# Patient Record
Sex: Male | Born: 1935 | Race: White | Hispanic: No | Marital: Married | Smoking: Never smoker
Health system: Southern US, Community
[De-identification: ages and names within clinical notes are randomized; demographics above are authoritative.]

## PROBLEM LIST (undated history)

## (undated) DIAGNOSIS — M1990 Unspecified osteoarthritis, unspecified site: Secondary | ICD-10-CM

## (undated) DIAGNOSIS — H269 Unspecified cataract: Secondary | ICD-10-CM

## (undated) DIAGNOSIS — E785 Hyperlipidemia, unspecified: Secondary | ICD-10-CM

## (undated) DIAGNOSIS — I2510 Atherosclerotic heart disease of native coronary artery without angina pectoris: Secondary | ICD-10-CM

## (undated) DIAGNOSIS — D649 Anemia, unspecified: Secondary | ICD-10-CM

## (undated) DIAGNOSIS — I10 Essential (primary) hypertension: Secondary | ICD-10-CM

## (undated) DIAGNOSIS — C4490 Unspecified malignant neoplasm of skin, unspecified: Secondary | ICD-10-CM

## (undated) DIAGNOSIS — F329 Major depressive disorder, single episode, unspecified: Secondary | ICD-10-CM

## (undated) DIAGNOSIS — N200 Calculus of kidney: Secondary | ICD-10-CM

## (undated) DIAGNOSIS — F0390 Unspecified dementia without behavioral disturbance: Secondary | ICD-10-CM

## (undated) DIAGNOSIS — F32A Depression, unspecified: Secondary | ICD-10-CM

## (undated) HISTORY — PX: CORONARY ARTERY BYPASS GRAFT: SHX141

## (undated) HISTORY — DX: Major depressive disorder, single episode, unspecified: F32.9

## (undated) HISTORY — PX: TOTAL HIP ARTHROPLASTY: SHX124

## (undated) HISTORY — DX: Hyperlipidemia, unspecified: E78.5

## (undated) HISTORY — DX: Anemia, unspecified: D64.9

## (undated) HISTORY — DX: Essential (primary) hypertension: I10

## (undated) HISTORY — DX: Unspecified malignant neoplasm of skin, unspecified: C44.90

## (undated) HISTORY — DX: Atherosclerotic heart disease of native coronary artery without angina pectoris: I25.10

## (undated) HISTORY — DX: Unspecified dementia, unspecified severity, without behavioral disturbance, psychotic disturbance, mood disturbance, and anxiety: F03.90

## (undated) HISTORY — DX: Unspecified osteoarthritis, unspecified site: M19.90

## (undated) HISTORY — DX: Depression, unspecified: F32.A

## (undated) HISTORY — DX: Unspecified cataract: H26.9

## (undated) HISTORY — DX: Calculus of kidney: N20.0

## (undated) HISTORY — PX: REPLACEMENT TOTAL KNEE: SUR1224

## (undated) HISTORY — PX: OTHER SURGICAL HISTORY: SHX169

---

## 1996-09-25 DIAGNOSIS — N200 Calculus of kidney: Secondary | ICD-10-CM

## 1996-09-25 HISTORY — DX: Calculus of kidney: N20.0

## 2016-05-16 LAB — VITAMIN B12: VITAMIN B 12: 420

## 2016-08-16 LAB — BASIC METABOLIC PANEL
GLUCOSE: 106
POTASSIUM: 3.7 (ref 3.4–5.3)
Sodium: 142 (ref 137–147)

## 2016-08-16 LAB — CBC AND DIFFERENTIAL
HEMATOCRIT: 39 — AB (ref 41–53)
Hemoglobin: 12.8 — AB (ref 13.5–17.5)
Platelets: 163 (ref 150–399)
WBC: 7.3

## 2016-08-16 LAB — HEPATIC FUNCTION PANEL
AST: 32 (ref 14–40)
Alkaline Phosphatase: 84 (ref 25–125)
BILIRUBIN, TOTAL: 0.7
Bilirubin, Direct: 0.2

## 2016-08-16 LAB — TSH: TSH: 5.79 (ref <=5.90)

## 2016-08-21 LAB — BASIC METABOLIC PANEL
BUN: 16 (ref 4–21)
CREATININE: 1.1 (ref <=1.3)

## 2016-12-13 LAB — CBC AND DIFFERENTIAL
HEMATOCRIT: 38 — AB (ref 41–53)
HEMOGLOBIN: 12.6 — AB (ref 13.5–17.5)
Platelets: 152 (ref 150–399)
WBC: 8.7

## 2016-12-13 LAB — BASIC METABOLIC PANEL
BUN: 18 (ref 4–21)
Creatinine: 1.1 (ref <=1.3)
GLUCOSE: 101
Potassium: 4.3 (ref 3.4–5.3)
SODIUM: 144 (ref 137–147)

## 2017-04-02 LAB — HEPATIC FUNCTION PANEL
ALT: 12 (ref 10–40)
AST: 13 — AB (ref 14–40)
Alkaline Phosphatase: 245 — AB (ref 25–125)
BILIRUBIN DIRECT: 0.2 (ref 0.01–0.4)
BILIRUBIN, TOTAL: 0.5

## 2017-04-02 LAB — LIPID PANEL
HDL: 60 (ref 35–70)
LDL CALC: 65
Triglycerides: 96 (ref 40–160)

## 2017-04-02 LAB — TSH: TSH: 4.94 (ref 0.41–5.90)

## 2017-04-02 LAB — BASIC METABOLIC PANEL
BUN: 22 — AB (ref 4–21)
CREATININE: 0.6 (ref 0.6–1.3)
POTASSIUM: 4.5 (ref 3.4–5.3)
SODIUM: 142 (ref 137–147)

## 2017-04-02 LAB — CBC AND DIFFERENTIAL
HCT: 37 — AB (ref 41–53)
Hemoglobin: 12.1 — AB (ref 13.5–17.5)
Platelets: 156 (ref 150–399)
WBC: 7.9

## 2017-04-09 LAB — HEPATIC FUNCTION PANEL: ALK PHOS: 94 (ref 25–125)

## 2017-04-12 LAB — HEPATIC FUNCTION PANEL: ALK PHOS: 94 (ref 25–125)

## 2017-04-27 LAB — HEPATIC FUNCTION PANEL: ALK PHOS: 94 (ref 25–125)

## 2017-08-20 ENCOUNTER — Encounter: Payer: Self-pay | Admitting: Nurse Practitioner

## 2017-08-20 ENCOUNTER — Ambulatory Visit (INDEPENDENT_AMBULATORY_CARE_PROVIDER_SITE_OTHER): Payer: Medicare Other | Admitting: Nurse Practitioner

## 2017-08-20 VITALS — BP 132/78 | HR 64 | Temp 98.7°F | Resp 17 | Ht 68.0 in | Wt 169.4 lb

## 2017-08-20 DIAGNOSIS — F0390 Unspecified dementia without behavioral disturbance: Secondary | ICD-10-CM | POA: Diagnosis not present

## 2017-08-20 DIAGNOSIS — I10 Essential (primary) hypertension: Secondary | ICD-10-CM | POA: Insufficient documentation

## 2017-08-20 DIAGNOSIS — E785 Hyperlipidemia, unspecified: Secondary | ICD-10-CM | POA: Diagnosis not present

## 2017-08-20 DIAGNOSIS — M1990 Unspecified osteoarthritis, unspecified site: Secondary | ICD-10-CM | POA: Diagnosis not present

## 2017-08-20 DIAGNOSIS — I25810 Atherosclerosis of coronary artery bypass graft(s) without angina pectoris: Secondary | ICD-10-CM

## 2017-08-20 DIAGNOSIS — I2510 Atherosclerotic heart disease of native coronary artery without angina pectoris: Secondary | ICD-10-CM | POA: Insufficient documentation

## 2017-08-20 NOTE — Patient Instructions (Addendum)
36 hour day   Increase activity, mental and physical stimulation.   Follow up in 6 weeks with fasting blood work prior to appt for EV

## 2017-08-20 NOTE — Progress Notes (Signed)
Careteam: Patient Care Team: [name], NP as PCP - General (Geriatric Medicine) Solic, [name], MD as Consulting Physician (Orthopedic Surgery)  Advanced Directive information Does Patient Have a Medical Advance Directive?: Yes, Type of Advance Directive: [place];Living will, Does patient want to make changes to medical advance directive?: No - Patient declined  No Known Allergies  Chief Complaint  Patient presents with  . Medical Management of Chronic Issues    Pt is being seen to establish care for management of chronic conditions.   . Other    Wife and daughter in room      HPI: Patient is a 83 y.o. male seen in the office today to establish care and medical management. Pt recently moved from [place] and moved in with daughter.  Last AWV was in the Spring and blood work as well was then   OA- takes routinely 1 in the AM and 1 in the PM which controls pain.   HTN- atenolol 12.5 mg daily and ASA daily   Dementia- taking aricept 10 mg daily and namenda 10 mg BID, has had a complete work up for this. Mild when originally diagnosised but this has been a few years Using fish oil for the mind.   Hyperlipidemia- zocor daily.  B12 def-  Currently on Vit B oral supplement.   Liberalized diet due to poor PO intake.   Hx of skin cancers, followed with dermatology in [place] has not seen anyone in [place] yet.   Hx of kidney stones, has changed diet and not having any further issues.   CAD- no symptoms, had a stress test that was abnormal and called him while he was on vacation to come in for CABG, then had stents a few years later.   Recent colonoscopy which was most likely normal, family unsure of follow up.     Review of Systems:  Review of Systems  Constitutional: Negative for chills, fever and weight loss.  HENT: Negative for tinnitus.   Respiratory: Negative for cough, sputum production and shortness of breath.   Cardiovascular:  Negative for chest pain, palpitations and leg swelling.  Gastrointestinal: Negative for abdominal pain, constipation, diarrhea and heartburn.       Fecal incontinence   Genitourinary: Positive for frequency. Negative for dysuria and urgency.  Musculoskeletal: Positive for joint pain. Negative for back pain, falls and myalgias.  Skin: Negative.        Hx of skin cancer  Neurological: Positive for weakness. Negative for dizziness and headaches.  Endo/Heme/Allergies: Bruises/bleeds easily.  Psychiatric/Behavioral: Positive for memory loss. Negative for depression. The patient does not have insomnia.     Past Medical History:  Diagnosis Date  . High blood pressure   . Kidney stone   . Skin cancer    face, ears   Past Surgical History:  Procedure Laterality Date  . kidney stone removal    . open heart surgery    . quadruple bypass     about 22 years ago  . REPLACEMENT TOTAL KNEE Left   . stints     has 3, about 20 years ago  . TOTAL HIP ARTHROPLASTY Right    2008   Social History:   reports that  has never smoked. he has never used smokeless tobacco. He reports that he does not drink alcohol or use drugs.  Family History  Problem Relation Age of Onset  . Stomach cancer Mother 72  . Esophageal cancer Mother 67  .  Stroke Father     Medications:   Medication List        Accurate as of 08/20/17  9:12 AM. Always use your most recent med list.          acetaminophen 500 MG tablet Commonly known as:  TYLENOL   aspirin EC 81 MG tablet   atenolol 25 MG tablet Commonly known as:  TENORMIN   donepezil 10 MG tablet Commonly known as:  ARICEPT   Fish Oil 500 MG Caps   memantine 10 MG tablet Commonly known as:  NAMENDA   simvastatin 10 MG tablet Commonly known as:  ZOCOR   vitamin B-12 500 MCG tablet Commonly known as:  CYANOCOBALAMIN        Physical Exam:  Vitals:   08/20/17 0854  BP: 132/78  Pulse: 64  Resp: 17  Temp: 98.7 F (37.1 C)  TempSrc:  Oral  SpO2: 97%  Weight: 169 lb 6.4 oz (76.8 kg)  Height: 5' 8"  (1.727 m)   Body mass index is 25.76 kg/m.  Physical Exam  Constitutional: He is oriented to person, place, and time. He appears well-developed and well-nourished. No distress.  HENT:  Head: Normocephalic and atraumatic.  Eyes: Conjunctivae and EOM are normal. Pupils are equal, round, and reactive to light.  Neck: Normal range of motion. Neck supple.  Cardiovascular: Normal rate, regular rhythm and normal heart sounds.  Pulmonary/Chest: Effort normal and breath sounds normal.  Abdominal: Soft. Bowel sounds are normal.  Musculoskeletal: He exhibits no edema or tenderness.       Right shoulder: He exhibits decreased range of motion.       Left shoulder: He exhibits decreased range of motion.  Neurological: He is alert and oriented to person, place, and time.  Skin: Skin is warm and dry. He is not diaphoretic.  Psychiatric: He has a normal mood and affect. Cognition and memory are impaired. He exhibits abnormal recent memory.    Labs reviewed: Basic Metabolic Panel: No results for input(s): NA, K, CL, CO2, GLUCOSE, BUN, CREATININE, CALCIUM, MG, PHOS, TSH in the last 8760 hours. Liver Function Tests: No results for input(s): AST, ALT, ALKPHOS, BILITOT, PROT, ALBUMIN in the last 8760 hours. No results for input(s): LIPASE, AMYLASE in the last 8760 hours. No results for input(s): AMMONIA in the last 8760 hours. CBC: No results for input(s): WBC, NEUTROABS, HGB, HCT, MCV, PLT in the last 8760 hours. Lipid Panel: No results for input(s): CHOL, HDL, LDLCALC, TRIG, CHOLHDL, LDLDIRECT in the last 8760 hours. TSH: No results for input(s): TSH in the last 8760 hours. A1C: No results found for: HGBA1C   Assessment/Plan 1. Hyperlipidemia, unspecified hyperlipidemia type -conts on zocor, due to dementia not on heart healthy diet.  - CMP with eGFR; Future - Lipid Panel; Future  2. Coronary artery disease involving coronary  bypass graft of native heart without angina pectoris S/p CABG and stent placement. Was established with cardiologist in [place] and saw routinely, will refer to Cardiology so he can have one in town.   3. Essential hypertension -controlled on atenolol.  - Ambulatory referral to Cardiology - CMP with eGFR; Future - CBC with Differential/Platelets; Future  4. Arthritis To bilateral shoulder and lower back, tylenol twice daily controls pain.   5. Dementia without behavioral disturbance, unspecified dementia type -pt with progressive memory loss, dx with dementia after workup, conts on aricept and namenda. Family helps take care of him.  - Ambulatory referral to [facility] for additional resources.  Next appt: 4 weeks for EV with MMSE and labs prior to appt.  Cody Morris. Harle Battiest  [facility] [phone] 8 am - 5 pm) [phone] (after hours)

## 2017-09-20 ENCOUNTER — Telehealth: Payer: Self-pay

## 2017-09-20 NOTE — Telephone Encounter (Signed)
SENT REFERRAL TO SCHEDULING 

## 2017-09-28 ENCOUNTER — Other Ambulatory Visit: Payer: Medicare Other

## 2017-09-28 DIAGNOSIS — E785 Hyperlipidemia, unspecified: Secondary | ICD-10-CM

## 2017-09-28 DIAGNOSIS — I10 Essential (primary) hypertension: Secondary | ICD-10-CM

## 2017-09-28 LAB — CBC WITH DIFFERENTIAL/PLATELET
BASOS ABS: 23 {cells}/uL (ref 0–200)
Basophils Relative: 0.3 %
EOS PCT: 3.3 %
Eosinophils Absolute: 257 cells/uL (ref 15–500)
HEMATOCRIT: 35.8 % — AB (ref 38.5–50.0)
HEMOGLOBIN: 12.2 g/dL — AB (ref 13.2–17.1)
LYMPHS ABS: 1287 {cells}/uL (ref 850–3900)
MCH: 34.4 pg — ABNORMAL HIGH (ref 27.0–33.0)
MCHC: 34.1 g/dL (ref 32.0–36.0)
MCV: 100.8 fL — ABNORMAL HIGH (ref 80.0–100.0)
MPV: 11.1 fL (ref 7.5–12.5)
Monocytes Relative: 6.3 %
NEUTROS ABS: 5741 {cells}/uL (ref 1500–7800)
Neutrophils Relative %: 73.6 %
Platelets: 178 10*3/uL (ref 140–400)
RBC: 3.55 10*6/uL — AB (ref 4.20–5.80)
RDW: 11.6 % (ref 11.0–15.0)
Total Lymphocyte: 16.5 %
WBC mixed population: 491 cells/uL (ref 200–950)
WBC: 7.8 10*3/uL (ref 3.8–10.8)

## 2017-09-28 LAB — COMPLETE METABOLIC PANEL WITH GFR
AG RATIO: 2.1 (calc) (ref 1.0–2.5)
ALT: 12 U/L (ref 9–46)
AST: 21 U/L (ref 10–35)
Albumin: 4 g/dL (ref 3.6–5.1)
Alkaline phosphatase (APISO): 81 U/L (ref 40–115)
BUN/Creatinine Ratio: 16 (calc) (ref 6–22)
BUN: 20 mg/dL (ref 7–25)
CALCIUM: 9.3 mg/dL (ref 8.6–10.3)
CHLORIDE: 110 mmol/L (ref 98–110)
CO2: 28 mmol/L (ref 20–32)
Creat: 1.29 mg/dL — ABNORMAL HIGH (ref 0.70–1.11)
GFR, EST AFRICAN AMERICAN: 59 mL/min/{1.73_m2} — AB (ref >=60)
GFR, EST NON AFRICAN AMERICAN: 51 mL/min/{1.73_m2} — AB (ref >=60)
GLOBULIN: 1.9 g/dL (ref 1.9–3.7)
Glucose, Bld: 94 mg/dL (ref 65–99)
POTASSIUM: 3.9 mmol/L (ref 3.5–5.3)
SODIUM: 143 mmol/L (ref 135–146)
TOTAL PROTEIN: 5.9 g/dL — AB (ref 6.1–8.1)
Total Bilirubin: 0.7 mg/dL (ref 0.2–1.2)

## 2017-09-28 LAB — LIPID PANEL
CHOL/HDL RATIO: 1.9 (calc) (ref <5.0)
Cholesterol: 90 mg/dL (ref <200)
HDL: 47 mg/dL (ref >40)
LDL Cholesterol (Calc): 29 mg/dL (calc)
Non-HDL Cholesterol (Calc): 43 mg/dL (calc) (ref <130)
Triglycerides: 64 mg/dL (ref <150)

## 2017-10-02 ENCOUNTER — Ambulatory Visit (INDEPENDENT_AMBULATORY_CARE_PROVIDER_SITE_OTHER): Payer: Medicare Other | Admitting: Nurse Practitioner

## 2017-10-02 ENCOUNTER — Encounter: Payer: Self-pay | Admitting: Nurse Practitioner

## 2017-10-02 VITALS — BP 130/84 | HR 81 | Temp 98.2°F | Resp 17 | Ht 68.0 in | Wt 168.4 lb

## 2017-10-02 DIAGNOSIS — M1990 Unspecified osteoarthritis, unspecified site: Secondary | ICD-10-CM

## 2017-10-02 DIAGNOSIS — D539 Nutritional anemia, unspecified: Secondary | ICD-10-CM

## 2017-10-02 DIAGNOSIS — E785 Hyperlipidemia, unspecified: Secondary | ICD-10-CM | POA: Diagnosis not present

## 2017-10-02 DIAGNOSIS — F0391 Unspecified dementia with behavioral disturbance: Secondary | ICD-10-CM

## 2017-10-02 DIAGNOSIS — I10 Essential (primary) hypertension: Secondary | ICD-10-CM | POA: Diagnosis not present

## 2017-10-02 DIAGNOSIS — G4720 Circadian rhythm sleep disorder, unspecified type: Secondary | ICD-10-CM

## 2017-10-02 MED ORDER — DONEPEZIL HCL 10 MG PO TABS: 10.0000 mg | ORAL_TABLET | Freq: Every day | ORAL | 1 refills | Status: AC

## 2017-10-02 MED ORDER — MEMANTINE HCL 10 MG PO TABS: 10.0000 mg | ORAL_TABLET | Freq: Two times a day (BID) | ORAL | 1 refills | Status: AC

## 2017-10-02 MED ORDER — SIMVASTATIN 10 MG PO TABS: 10.0000 mg | ORAL_TABLET | Freq: Every day | ORAL | 1 refills | Status: AC

## 2017-10-02 NOTE — Patient Instructions (Addendum)
Consider Seroquel for sleep, behaviors and hallucinations This medication is not without side effects, could have gait abnormality, increase fatigue during the day- however we will start at a low dose if you start this medication Also could do another sleep aid (Trazadone) but it would not help behaviors or hallucinations. To avoid tylenol PM Can also try melatonin 3 mg every night for sleep. To take aricept in the morning.   To keep up during the day so he sleeps at night.  To follow up in 3 months with Dr Mariea Clonts for routine follow up

## 2017-10-02 NOTE — Progress Notes (Signed)
Careteam: Patient Care Team: [name], NP as PCP - General (Geriatric Medicine) Solic, [name], MD as Consulting Physician (Orthopedic Surgery)  Advanced Directive information Type of Advance Directive: [place];Living will, Does patient want to make changes to medical advance directive?: No - Patient declined  No Known Allergies  Chief Complaint  Patient presents with  . Medical Management of Chronic Issues    Pt is being seen for a complete physical for management of chronic conditions.   Marland Kitchen MMSE    15/30: failed clock drawing.   . Depression    Score of 0  . Medication Refill    Rx pended for donepezil, memantine, simvastatin     HPI: Patient is a 84 y.o. male seen in the office today for extended visit and medical management. Here with daughter today.  Pt with hx of dementia, OA, HTN, CAD, hyperlipidemia.  Dementia- MMSE today 15/30, daughter report some hallucinations. More confusion lately. Wondering where his wife was- she had been there the whole time.  Giving him tylenol PM to help him sleep, very restless, wandering around and increased behaviors with worsening confusion in the evening.  Bad days are coming more frequently.  Losing more of his ADLs, wife is his primary caretaker  Dreams vividly- taking aricept in the pm.    CAD- follow up scheduled with cardiologist- no complaints of chest pains, never had chest pains evxen prior to CABG.   Review of Systems:  Review of Systems  Constitutional: Negative for chills, fever and weight loss.  HENT: Negative for tinnitus.   Respiratory: Negative for cough, sputum production and shortness of breath.   Cardiovascular: Negative for chest pain, palpitations and leg swelling.  Gastrointestinal: Negative for abdominal pain, constipation, diarrhea and heartburn.       Fecal incontinence   Genitourinary: Negative for dysuria, frequency and urgency.  Musculoskeletal: Positive for falls and joint  pain. Negative for back pain and myalgias.  Skin: Negative.        Hx of skin cancer  Neurological: Positive for weakness. Negative for dizziness and headaches.  Endo/Heme/Allergies: Bruises/bleeds easily.  Psychiatric/Behavioral: Positive for memory loss. Negative for depression. The patient does not have insomnia.     Past Medical History:  Diagnosis Date  . Arthritis   . CAD (coronary artery disease)   . High blood pressure   . Hyperlipidemia   . Kidney stones 1998  . Skin cancer    face, ears   Past Surgical History:  Procedure Laterality Date  . CORONARY ARTERY BYPASS GRAFT    . kidney stone removal    . open heart surgery    . quadruple bypass     about 22 years ago  . REPLACEMENT TOTAL KNEE Left   . stints     has 3, about 20 years ago  . TOTAL HIP ARTHROPLASTY Right    2008   Social History:   reports that  has never smoked. he has never used smokeless tobacco. He reports that he does not drink alcohol or use drugs.  Family History  Problem Relation Age of Onset  . Stomach cancer Mother 5  . Esophageal cancer Mother 27  . Stroke Father     Medications:   Medication List        Accurate as of 10/02/17 11:04 AM. Always use your most recent med list.          acetaminophen 500 MG tablet Commonly known as:  TYLENOL  aspirin EC 81 MG tablet   atenolol 25 MG tablet Commonly known as:  TENORMIN   diphenhydramine-acetaminophen 25-500 MG Tabs tablet Commonly known as:  TYLENOL PM   donepezil 10 MG tablet Commonly known as:  ARICEPT   Fish Oil 500 MG Caps   memantine 10 MG tablet Commonly known as:  NAMENDA   simvastatin 10 MG tablet Commonly known as:  ZOCOR   vitamin B-12 500 MCG tablet Commonly known as:  CYANOCOBALAMIN        Physical Exam:  Vitals:   10/02/17 1049  BP: 130/84  Pulse: 81  Resp: 17  Temp: 98.2 F (36.8 C)  TempSrc: Oral  SpO2: 98%  Weight: 168 lb 6.4 oz (76.4 kg)  Height: 5\' 8"  (1.727 m)   Body mass index  is 25.61 kg/m.  Physical Exam  Constitutional: He is oriented to person, place, and time. He appears well-developed and well-nourished. No distress.  HENT:  Head: Normocephalic and atraumatic.  Right Ear: External ear normal.  Left Ear: External ear normal.  Nose: Nose normal.  Mouth/Throat: Oropharynx is clear and moist. No oropharyngeal exudate.  Eyes: Conjunctivae and EOM are normal. Pupils are equal, round, and reactive to light.  Neck: Normal range of motion. Neck supple.  Cardiovascular: Normal rate, regular rhythm and normal heart sounds.  Pulmonary/Chest: Effort normal and breath sounds normal.  Abdominal: Soft. Bowel sounds are normal.  Musculoskeletal: He exhibits no edema or tenderness.       Right shoulder: He exhibits decreased range of motion.       Left shoulder: He exhibits decreased range of motion.       Right hip: He exhibits decreased range of motion.  Neurological: He is alert and oriented to person, place, and time.  Skin: Skin is warm and dry. He is not diaphoretic.  Psychiatric: He has a normal mood and affect. Cognition and memory are impaired. He exhibits abnormal recent memory.    Labs reviewed: Basic Metabolic Panel: Recent Labs    09/28/17 0943  NA 143  K 3.9  CL 110  CO2 28  GLUCOSE 94  BUN 20  CREATININE 1.29*  CALCIUM 9.3   Liver Function Tests: Recent Labs    09/28/17 0943  AST 21  ALT 12  BILITOT 0.7  PROT 5.9*   No results for input(s): LIPASE, AMYLASE in the last 8760 hours. No results for input(s): AMMONIA in the last 8760 hours. CBC: Recent Labs    09/28/17 0943  WBC 7.8  NEUTROABS 5,741  HGB 12.2*  HCT 35.8*  MCV 100.8*  PLT 178   Lipid Panel: Recent Labs    09/28/17 0943  CHOL 90  HDL 47  TRIG 64  CHOLHDL 1.9   TSH: No results for input(s): TSH in the last 8760 hours. A1C: No results found for: HGBA1C   Assessment/Plan 1. Dementia with behavioral disturbance, unspecified dementia type -progressive  decline in memory, MMSE 15/30 today -now with hallucinations and increase in behaviors and wandering at night. Also not sleeping well. -discussing moving aricept to daytime due to vivid dreams.  -discussed seroquel start with daughter however ultimately wife will decide. Discussed risk vs benefits.  - donepezil (ARICEPT) 10 MG tablet; Take 1 tablet (10 mg total) by mouth at bedtime.  Dispense: 90 tablet; Refill: 1 - memantine (NAMENDA) 10 MG tablet; Take 1 tablet (10 mg total) by mouth 2 (two) times daily.  Dispense: 180 tablet; Refill: 1  2. Essential hypertension -stable on current regimen  3. Macrocytic anemia -on B12 supplement, no recent B12 level will follow up  - Vitamin B12  4. Hyperlipidemia, unspecified hyperlipidemia type LDL at goal - simvastatin (ZOCOR) 10 MG tablet; Take 1 tablet (10 mg total) by mouth daily.  Dispense: 90 tablet; Refill: 1  5. Arthritis Mostly in shoulder and right knee and hip, tylenol controls pain.   6. Sleep-wake cycle disorder Sleeping more during the day and not as much at night. Encouraged activities and stimulatin during the daytime hours so he will sleep more at night.   Next appt: 3 months with Dr Mariea Clonts, sooner if needed  [place] K. Harle Battiest  [facility] [phone] 8 am - 5 pm) [phone] (after hours)

## 2017-10-03 LAB — VITAMIN B12: Vitamin B-12: 358 pg/mL (ref 200–1100)

## 2017-10-04 MED ORDER — QUETIAPINE FUMARATE 25 MG PO TABS: 12.5000 mg | ORAL_TABLET | Freq: Every day | ORAL | 1 refills | Status: DC

## 2017-10-04 NOTE — Addendum Note (Signed)
Addended by: [name] on: 10/04/2017 10:02 AM   Modules accepted: Orders

## 2017-10-11 ENCOUNTER — Telehealth: Payer: Self-pay | Admitting: *Deleted

## 2017-10-11 NOTE — Telephone Encounter (Signed)
Wife notified and agreed. Will call us with any changes.

## 2017-10-11 NOTE — Telephone Encounter (Signed)
Okay to give him mucinex DM (this is for cough and congestion) twice daily with full glass of water. Would monitor the talking in sleep this may improve

## 2017-10-11 NOTE — Telephone Encounter (Signed)
Patient wife, Kalman Shan called and stated that patient now has the cold she had and wants to know if it would be ok to give him the Mucinex. Has Nasal congestion and cough.   Also stated that since starting the new medication Seroquel (only giving 1/2 tablet) he is constantly talking in his sleep. Talks all night. Talks out of his head. Talks like he's someplace else.   Please Advise.

## 2017-10-12 ENCOUNTER — Telehealth: Payer: Self-pay | Admitting: *Deleted

## 2017-10-12 MED ORDER — BENZONATATE 100 MG PO CAPS: 100.0000 mg | ORAL_CAPSULE | Freq: Three times a day (TID) | ORAL | 0 refills | Status: AC | PRN

## 2017-10-12 NOTE — Telephone Encounter (Signed)
Patient wife, Kalman Shan called and stated that she would like to discontinue the new medication Seroquel. Stated that patient had a bad night last night talking out of head and is like a Zombie.  Also cough is getting worse and he is taking the Mucinex DM. Non productive. No fever. Please Advise.

## 2017-10-12 NOTE — Telephone Encounter (Signed)
Okay to stop seroquel To cont mucinex DM by mouth twice daily with full glass of water, increase hydration Rx sent to pharmacy for benzonatate for cough, can take in addition to mucinex DM

## 2017-10-12 NOTE — Telephone Encounter (Signed)
Patient wife notified. LM on Voicemail with Jessica's directions.

## 2017-10-15 ENCOUNTER — Encounter: Payer: Self-pay | Admitting: *Deleted

## 2017-10-17 ENCOUNTER — Encounter: Payer: Self-pay | Admitting: *Deleted

## 2017-10-26 DEATH — deceased

## 2017-10-29 ENCOUNTER — Encounter: Payer: Self-pay | Admitting: Internal Medicine

## 2017-10-29 ENCOUNTER — Ambulatory Visit (INDEPENDENT_AMBULATORY_CARE_PROVIDER_SITE_OTHER): Payer: Medicare Other | Admitting: Internal Medicine

## 2017-10-29 VITALS — BP 130/80 | HR 57 | Ht 68.0 in | Wt 162.1 lb

## 2017-10-29 DIAGNOSIS — F0151 Vascular dementia with behavioral disturbance: Secondary | ICD-10-CM

## 2017-10-29 DIAGNOSIS — F01518 Vascular dementia, unspecified severity, with other behavioral disturbance: Secondary | ICD-10-CM

## 2017-10-29 DIAGNOSIS — I10 Essential (primary) hypertension: Secondary | ICD-10-CM

## 2017-10-29 DIAGNOSIS — I25810 Atherosclerosis of coronary artery bypass graft(s) without angina pectoris: Secondary | ICD-10-CM

## 2017-10-29 DIAGNOSIS — E782 Mixed hyperlipidemia: Secondary | ICD-10-CM | POA: Diagnosis not present

## 2017-10-29 NOTE — Progress Notes (Signed)
Cardiology Office Note   Date:  10/29/2017   ID:  Cody Morris, DOB May 02, 1932, MRN [id number]  PCP:  [name], NP  Cardiologist:   Dorris Carnes, MD   Pt referred by Joelene Millin for continued care of CAD     History of Present Illness: Cody Morris is a 84 y.o. male with a history of orthostatic hypotension.  Has fallen  No syncope  Seen at [facility] in past Also a history of CAD (s/p CABG in 1998; LIMA to LAD; SVG to OM)  Underwent PTCA/DES  in 12/03 to 3 to LCx) .  Also a history of HTN and HL  Tripped over curb 6 months ago  [place] [address] [facility] [employer] says breathing is OK  Never had CP  No edema Pt not too active due to dementia.     Current Meds  Medication Sig  . acetaminophen (TYLENOL) 500 MG tablet Take 500 mg by mouth every 6 (six) hours as needed.  Marland Kitchen aspirin EC 81 MG tablet Take 81 mg by mouth daily.  Marland Kitchen atenolol (TENORMIN) 25 MG tablet Take 12.5 mg by mouth daily.  . benzonatate (TESSALON) 100 MG capsule Take 1 capsule (100 mg total) by mouth 3 (three) times daily as needed for cough.  . donepezil (ARICEPT) 10 MG tablet Take 1 tablet (10 mg total) by mouth at bedtime.  . memantine (NAMENDA) 10 MG tablet Take 1 tablet (10 mg total) by mouth 2 (two) times daily.  . Omega-3 Fatty Acids (FISH OIL) 500 MG CAPS Take 1 capsule by mouth daily.  . simvastatin (ZOCOR) 10 MG tablet Take 1 tablet (10 mg total) by mouth daily.  . vitamin B-12 (CYANOCOBALAMIN) 500 MCG tablet Take 500 mcg by mouth daily.     Allergies:   Patient has no known allergies.   Past Medical History:  Diagnosis Date  . Anemia   . Arthritis   . CAD (coronary artery disease)   . Cataract   . Depression   . High blood pressure   . Hyperlipidemia   . Kidney stones 1998  . Skin cancer    face, ears  . Unspecified dementia without behavioral disturbance     Past Surgical History:  Procedure Laterality Date  . CORONARY ARTERY BYPASS GRAFT    . kidney stone removal    . open heart surgery    .  quadruple bypass     about 22 years ago  . REPLACEMENT TOTAL KNEE Left   . stints     has 3, about 20 years ago  . TOTAL HIP ARTHROPLASTY Right    2008     Social History:  The patient  reports that  has never smoked. he has never used smokeless tobacco. He reports that he does not drink alcohol or use drugs.   Family History:  The patient's family history includes Esophageal cancer (age of onset: 33) in his mother; Stomach cancer (age of onset: 22) in his mother; Stroke in his father.    ROS:  Please see the history of present illness. All other systems are reviewed and  Negative to the above problem except as noted.    PHYSICAL EXAM: VS:  BP 130/80   Pulse (!) 57   Ht 5\' 8"  (1.727 m)   Wt 162 lb 1.9 oz (73.5 kg)   BMI 24.65 kg/m   GEN: Well nourished, well developed, in no acute distress  HEENT: normal  Neck: no JVD, carotid bruits,  or masses Cardiac: RRR; no murmurs, rubs, or gallops,no edema  Respiratory:  clear to auscultation bilaterally, normal work of breathing GI: soft, nontender, nondistended, + BS  (hyperactive)  No hepatomegaly  MS: no deformity Moving all extremities   Skin: warm and dry, no rash Neuro:  Deferred   Psych: euthymic mood, full affect   EKG:  EKG is ordered today.SB   57   RBBB     Lipid Panel    Component Value Date/Time   CHOL 90 09/28/2017 0943   TRIG 64 09/28/2017 0943   HDL 47 09/28/2017 0943   CHOLHDL 1.9 09/28/2017 0943   LDLCALC 65 04/02/2017      Wt Readings from Last 3 Encounters:  10/29/17 162 lb 1.9 oz (73.5 kg)  10/02/17 168 lb 6.4 oz (76.4 kg)  08/20/17 169 lb 6.4 oz (76.8 kg)      ASSESSMENT AND PLAN:  1  CAD  Remote CABG then intervention I am not convinced of active ischemia  Will be available as neede  2  HL  Excellent control LDL 29 1 month ago   3  BP  BP is OK   WIll stop atenolol  HR is a little low  He is on a low dose    4  Dementia  Significant  Lives at home with daughter and wife now.     Will  be available as needed for f/u  Current medicines are reviewed at length with the patient today.  The patient does not have concerns regarding medicines.  Signed, Dorris Carnes, MD  10/29/2017 1:51 PM    [facility] [address], [place], [place]  [address] Phone: [phone]; Fax: [phone]

## 2017-10-29 NOTE — Patient Instructions (Signed)
Your physician has recommended you make the following change in your medication:  1.) ok to stop atenolol  Your physician recommends that you schedule a follow-up appointment as needed with Dr. Harrington Challenger.

## 2017-12-11 ENCOUNTER — Emergency Department (HOSPITAL_COMMUNITY): Payer: Medicare Other

## 2017-12-11 ENCOUNTER — Other Ambulatory Visit: Payer: Self-pay

## 2017-12-11 ENCOUNTER — Inpatient Hospital Stay (HOSPITAL_COMMUNITY)
Admission: EM | Admit: 2017-12-11 | Discharge: 2017-12-14 | DRG: 481 | Disposition: A | Discharge status: Skilled Nursing Facility | Payer: Medicare Other | Attending: Internal Medicine | Admitting: Internal Medicine

## 2017-12-11 ENCOUNTER — Encounter (HOSPITAL_COMMUNITY): Payer: Self-pay | Admitting: Emergency Medicine

## 2017-12-11 DIAGNOSIS — N179 Acute kidney failure, unspecified: Secondary | ICD-10-CM | POA: Diagnosis present

## 2017-12-11 DIAGNOSIS — Z85828 Personal history of other malignant neoplasm of skin: Secondary | ICD-10-CM | POA: Diagnosis not present

## 2017-12-11 DIAGNOSIS — M9712XA Periprosthetic fracture around internal prosthetic left knee joint, initial encounter: Secondary | ICD-10-CM | POA: Diagnosis present

## 2017-12-11 DIAGNOSIS — Z951 Presence of aortocoronary bypass graft: Secondary | ICD-10-CM | POA: Diagnosis not present

## 2017-12-11 DIAGNOSIS — M1990 Unspecified osteoarthritis, unspecified site: Secondary | ICD-10-CM | POA: Diagnosis present

## 2017-12-11 DIAGNOSIS — Z7982 Long term (current) use of aspirin: Secondary | ICD-10-CM

## 2017-12-11 DIAGNOSIS — W010XXA Fall on same level from slipping, tripping and stumbling without subsequent striking against object, initial encounter: Secondary | ICD-10-CM | POA: Diagnosis not present

## 2017-12-11 DIAGNOSIS — I2510 Atherosclerotic heart disease of native coronary artery without angina pectoris: Secondary | ICD-10-CM | POA: Diagnosis present

## 2017-12-11 DIAGNOSIS — E785 Hyperlipidemia, unspecified: Secondary | ICD-10-CM | POA: Diagnosis present

## 2017-12-11 DIAGNOSIS — Z800 Family history of malignant neoplasm of digestive organs: Secondary | ICD-10-CM

## 2017-12-11 DIAGNOSIS — F0390 Unspecified dementia without behavioral disturbance: Secondary | ICD-10-CM | POA: Diagnosis present

## 2017-12-11 DIAGNOSIS — S72335A Nondisplaced oblique fracture of shaft of left femur, initial encounter for closed fracture: Secondary | ICD-10-CM

## 2017-12-11 DIAGNOSIS — Y92009 Unspecified place in unspecified non-institutional (private) residence as the place of occurrence of the external cause: Secondary | ICD-10-CM | POA: Diagnosis not present

## 2017-12-11 DIAGNOSIS — Z66 Do not resuscitate: Secondary | ICD-10-CM | POA: Diagnosis present

## 2017-12-11 DIAGNOSIS — E538 Deficiency of other specified B group vitamins: Secondary | ICD-10-CM | POA: Diagnosis present

## 2017-12-11 DIAGNOSIS — Z96641 Presence of right artificial hip joint: Secondary | ICD-10-CM | POA: Diagnosis present

## 2017-12-11 DIAGNOSIS — S72452A Displaced supracondylar fracture without intracondylar extension of lower end of left femur, initial encounter for closed fracture: Principal | ICD-10-CM | POA: Diagnosis present

## 2017-12-11 DIAGNOSIS — G309 Alzheimer's disease, unspecified: Secondary | ICD-10-CM | POA: Diagnosis present

## 2017-12-11 DIAGNOSIS — F0280 Dementia in other diseases classified elsewhere without behavioral disturbance: Secondary | ICD-10-CM | POA: Diagnosis present

## 2017-12-11 DIAGNOSIS — D62 Acute posthemorrhagic anemia: Secondary | ICD-10-CM | POA: Diagnosis not present

## 2017-12-11 DIAGNOSIS — Z87442 Personal history of urinary calculi: Secondary | ICD-10-CM

## 2017-12-11 DIAGNOSIS — Z419 Encounter for procedure for purposes other than remedying health state, unspecified: Secondary | ICD-10-CM

## 2017-12-11 DIAGNOSIS — S72402A Unspecified fracture of lower end of left femur, initial encounter for closed fracture: Secondary | ICD-10-CM | POA: Diagnosis not present

## 2017-12-11 DIAGNOSIS — T1490XA Injury, unspecified, initial encounter: Secondary | ICD-10-CM

## 2017-12-11 DIAGNOSIS — Z955 Presence of coronary angioplasty implant and graft: Secondary | ICD-10-CM

## 2017-12-11 DIAGNOSIS — I4510 Unspecified right bundle-branch block: Secondary | ICD-10-CM | POA: Diagnosis not present

## 2017-12-11 DIAGNOSIS — Z823 Family history of stroke: Secondary | ICD-10-CM | POA: Diagnosis not present

## 2017-12-11 DIAGNOSIS — I10 Essential (primary) hypertension: Secondary | ICD-10-CM | POA: Diagnosis present

## 2017-12-11 LAB — CBC WITH DIFFERENTIAL/PLATELET
BASOS PCT: 0 %
Basophils Absolute: 0 10*3/uL (ref 0.0–0.1)
EOS ABS: 0.3 10*3/uL (ref 0.0–0.7)
Eosinophils Relative: 3 %
HCT: 37.1 % — ABNORMAL LOW (ref 39.0–52.0)
Hemoglobin: 12.1 g/dL — ABNORMAL LOW (ref 13.0–17.0)
Lymphocytes Relative: 12 %
Lymphs Abs: 1.3 10*3/uL (ref 0.7–4.0)
MCH: 34 pg (ref 26.0–34.0)
MCHC: 32.6 g/dL (ref 30.0–36.0)
MCV: 104.2 fL — ABNORMAL HIGH (ref 78.0–100.0)
Monocytes Absolute: 0.8 10*3/uL (ref 0.1–1.0)
Monocytes Relative: 7 %
Neutro Abs: 8.4 10*3/uL — ABNORMAL HIGH (ref 1.7–7.7)
Neutrophils Relative %: 78 %
PLATELETS: 179 10*3/uL (ref 150–400)
RBC: 3.56 MIL/uL — AB (ref 4.22–5.81)
RDW: 12.6 % (ref 11.5–15.5)
WBC: 10.8 10*3/uL — AB (ref 4.0–10.5)

## 2017-12-11 LAB — BASIC METABOLIC PANEL
Anion gap: 7 (ref 5–15)
BUN: 21 mg/dL — ABNORMAL HIGH (ref 6–20)
CALCIUM: 9.1 mg/dL (ref 8.9–10.3)
CO2: 27 mmol/L (ref 22–32)
CREATININE: 1.35 mg/dL — AB (ref 0.61–1.24)
Chloride: 108 mmol/L (ref 101–111)
GFR, EST AFRICAN AMERICAN: 54 mL/min — AB (ref >60)
GFR, EST NON AFRICAN AMERICAN: 47 mL/min — AB (ref >60)
Glucose, Bld: 106 mg/dL — ABNORMAL HIGH (ref 65–99)
Potassium: 3.9 mmol/L (ref 3.5–5.1)
SODIUM: 142 mmol/L (ref 135–145)

## 2017-12-11 LAB — PROTIME-INR
INR: 1.14
PROTHROMBIN TIME: 14.5 s (ref 11.4–15.2)

## 2017-12-11 LAB — TYPE AND SCREEN
ABO/RH(D): A POS
ANTIBODY SCREEN: NEGATIVE

## 2017-12-11 LAB — ABO/RH: ABO/RH(D): A POS

## 2017-12-11 MED ORDER — POVIDONE-IODINE 10 % EX SWAB: 2.0000 "application " | Freq: Once | CUTANEOUS | Status: DC

## 2017-12-11 MED ORDER — CHLORHEXIDINE GLUCONATE 4 % EX LIQD: 60.0000 mL | Freq: Once | CUTANEOUS | Status: DC

## 2017-12-11 MED ORDER — ENOXAPARIN SODIUM 40 MG/0.4ML ~~LOC~~ SOLN
40.0000 mg | SUBCUTANEOUS | Status: DC
  Administered 2017-12-12 – 2017-12-13 (×2): 40 mg via SUBCUTANEOUS
  Filled 2017-12-11 (×2): qty 0.4

## 2017-12-11 MED ORDER — MEMANTINE HCL 10 MG PO TABS
10.0000 mg | ORAL_TABLET | Freq: Two times a day (BID) | ORAL | Status: DC
  Administered 2017-12-12 – 2017-12-14 (×5): 10 mg via ORAL
  Filled 2017-12-11 (×6): qty 1

## 2017-12-11 MED ORDER — MORPHINE SULFATE (PF) 4 MG/ML IV SOLN: 4.0000 mg | Freq: Once | INTRAVENOUS | Status: DC

## 2017-12-11 MED ORDER — CEFAZOLIN SODIUM-DEXTROSE 2-4 GM/100ML-% IV SOLN
2.0000 g | INTRAVENOUS | Status: AC
  Administered 2017-12-12: 2 g via INTRAVENOUS
  Filled 2017-12-11 (×3): qty 100

## 2017-12-11 MED ORDER — ACETAMINOPHEN 500 MG PO TABS
500.0000 mg | ORAL_TABLET | Freq: Four times a day (QID) | ORAL | Status: DC | PRN
  Administered 2017-12-13: 500 mg via ORAL
  Filled 2017-12-11: qty 1

## 2017-12-11 MED ORDER — MORPHINE SULFATE (PF) 4 MG/ML IV SOLN
4.0000 mg | INTRAVENOUS | Status: DC | PRN
  Administered 2017-12-11: 4 mg via INTRAVENOUS
  Filled 2017-12-11 (×2): qty 1

## 2017-12-11 MED ORDER — MORPHINE SULFATE (PF) 2 MG/ML IV SOLN
1.0000 mg | INTRAVENOUS | Status: DC | PRN
  Administered 2017-12-11: 2 mg via INTRAVENOUS
  Filled 2017-12-11: qty 1

## 2017-12-11 MED ORDER — ONDANSETRON HCL 4 MG/2ML IJ SOLN
4.0000 mg | Freq: Four times a day (QID) | INTRAMUSCULAR | Status: DC | PRN
  Administered 2017-12-11: 4 mg via INTRAVENOUS
  Filled 2017-12-11: qty 2

## 2017-12-11 MED ORDER — MORPHINE SULFATE (PF) 4 MG/ML IV SOLN
4.0000 mg | Freq: Once | INTRAVENOUS | Status: AC
  Administered 2017-12-11: 4 mg via INTRAVENOUS

## 2017-12-11 MED ORDER — VITAMIN B-12 1000 MCG PO TABS
500.0000 ug | ORAL_TABLET | Freq: Every day | ORAL | Status: DC
  Filled 2017-12-11 (×2): qty 1

## 2017-12-11 MED ORDER — SODIUM CHLORIDE 0.9 % IV SOLN
INTRAVENOUS | Status: DC
  Administered 2017-12-11: 12:00:00 via INTRAVENOUS

## 2017-12-11 MED ORDER — DONEPEZIL HCL 10 MG PO TABS
10.0000 mg | ORAL_TABLET | Freq: Every day | ORAL | Status: DC
  Administered 2017-12-12 – 2017-12-13 (×2): 10 mg via ORAL
  Filled 2017-12-11 (×2): qty 1

## 2017-12-11 MED ORDER — SIMVASTATIN 10 MG PO TABS: 10.0000 mg | ORAL_TABLET | Freq: Every day | ORAL | Status: DC

## 2017-12-11 NOTE — H&P (Addendum)
History and Physical    HAREL REPETTO KCL:[id number] DOB: Apr 17, 1932 DOA: 12/11/2017  PCP: [name], NP  Patient coming from: Home.   I have personally briefly reviewed patient's old medical records in [place]  Chief Complaint: mechanical fall this morning.   HPI: Cody Morris is a 84 y.o. male with medical history significant of dementia, hypertension, CAD, s/p CABG, hyperlipidemia, arthritis, mild anemia, b12 deficiency, h/o kidney stones, presents to [facility] after a mechanical fall this am. Pt got up from the bed and tripped and fell on his left side. He was nt able to put weight on the left lower extremity.lab work in ED showed creatinine of 1.35, wbc count of 10.8 and hemoglobin of 12.1. X rays of the left lower extremity shows oblique fracture at the mid to distal femur.  Pt was referred to medical service for admission .     Review of Systems:pt has dementia,most of the history obtained from daughter and wife yesterday. Detailed ROS couldn't be obtained.    Past Medical History:  Diagnosis Date  . Anemia   . Arthritis   . CAD (coronary artery disease)   . Cataract   . Depression   . High blood pressure   . Hyperlipidemia   . Kidney stones 1998  . Skin cancer    face, ears  . Unspecified dementia without behavioral disturbance     Past Surgical History:  Procedure Laterality Date  . CORONARY ARTERY BYPASS GRAFT    . kidney stone removal    . open heart surgery    . quadruple bypass     about 22 years ago  . REPLACEMENT TOTAL KNEE Left   . stints     has 3, about 20 years ago  . TOTAL HIP ARTHROPLASTY Right    2008     reports that  has never smoked. he has never used smokeless tobacco. He reports that he does not drink alcohol or use drugs.  No Known Allergies  Family History  Problem Relation Age of Onset  . Stomach cancer Mother 6  . Esophageal cancer Mother 39  . Stroke Father    Reviewed.   Prior to Admission medications     Medication Sig Start Date End Date Taking? Authorizing Provider  acetaminophen (TYLENOL) 500 MG tablet Take 500 mg by mouth every 6 (six) hours as needed.    [provider]  aspirin EC 81 MG tablet Take 81 mg by mouth daily.    [provider]  benzonatate (TESSALON) 100 MG capsule Take 1 capsule (100 mg total) by mouth 3 (three) times daily as needed for cough. 10/12/17   [name], NP  donepezil (ARICEPT) 10 MG tablet Take 1 tablet (10 mg total) by mouth at bedtime. 10/02/17   [name], NP  memantine (NAMENDA) 10 MG tablet Take 1 tablet (10 mg total) by mouth 2 (two) times daily. 10/02/17   [name], NP  Omega-3 Fatty Acids (FISH OIL) 500 MG CAPS Take 1 capsule by mouth daily.    [provider]  simvastatin (ZOCOR) 10 MG tablet Take 1 tablet (10 mg total) by mouth daily. 10/02/17   [name], NP  vitamin B-12 (CYANOCOBALAMIN) 500 MCG tablet Take 500 mcg by mouth daily.    [provider]    Physical Exam: Vitals:   12/11/17 0730 12/11/17 0930 12/11/17 1000 12/11/17 1030  BP: (!) 148/71 123/88 128/86 132/74  Pulse: 64 73  66 75  Resp: 14 14 13 13   Temp:      TempSrc:      SpO2: 98% 100% 98% 98%    Constitutional: NAD, calm, comfortable Vitals:   12/11/17 0730 12/11/17 0930 12/11/17 1000 12/11/17 1030  BP: (!) 148/71 123/88 128/86 132/74  Pulse: 64 73 66 75  Resp: 14 14 13 13   Temp:      TempSrc:      SpO2: 98% 100% 98% 98%   Eyes: PERRL, lids and conjunctivae normal ENMT: Mucous membranes are moist. Posterior pharynx clear of any exudate or lesions.Normal dentition.  Neck: normal, supple, no masses, no thyromegaly Respiratory: clear to auscultation bilaterally, no wheezing, no crackles. Normal respiratory effort. No accessory muscle use.  Cardiovascular: Regular rate and rhythm, no murmurs. No extremity edema. 2+ pedal pulses. No carotid bruits.  Abdomen: no tenderness, no masses palpated. No  hepatosplenomegaly. Bowel sounds positive.  Musculoskeletal:left lower extremity bandaged. No pedal edema.  Skin: no rashes, lesions, ulcers. No induration Neurologic: CN 2-12 grossly intact. Sensation intact, unable to put weight on the left leg.  Psychiatric: alert, oriented to place and person, not to time.   Labs on Admission: I have personally reviewed following labs and imaging studies  CBC: Recent Labs  Lab 12/11/17 0802  WBC 10.8*  NEUTROABS 8.4*  HGB 12.1*  HCT 37.1*  MCV 104.2*  PLT 481   Basic Metabolic Panel: Recent Labs  Lab 12/11/17 0802  NA 142  K 3.9  CL 108  CO2 27  GLUCOSE 106*  BUN 21*  CREATININE 1.35*  CALCIUM 9.1   GFR: CrCl cannot be calculated (Unknown ideal weight.). Liver Function Tests: No results for input(s): AST, ALT, ALKPHOS, BILITOT, PROT, ALBUMIN in the last 168 hours. No results for input(s): LIPASE, AMYLASE in the last 168 hours. No results for input(s): AMMONIA in the last 168 hours. Coagulation Profile: Recent Labs  Lab 12/11/17 0802  INR 1.14   Cardiac Enzymes: No results for input(s): CKTOTAL, CKMB, CKMBINDEX, TROPONINI in the last 168 hours. BNP (last 3 results) No results for input(s): PROBNP in the last 8760 hours. HbA1C: No results for input(s): HGBA1C in the last 72 hours. CBG: No results for input(s): GLUCAP in the last 168 hours. Lipid Profile: No results for input(s): CHOL, HDL, LDLCALC, TRIG, CHOLHDL, LDLDIRECT in the last 72 hours. Thyroid Function Tests: No results for input(s): TSH, T4TOTAL, FREET4, T3FREE, THYROIDAB in the last 72 hours. Anemia Panel: No results for input(s): VITAMINB12, FOLATE, FERRITIN, TIBC, IRON, RETICCTPCT in the last 72 hours. Urine analysis: No results found for: COLORURINE, APPEARANCEUR, LABSPEC, PHURINE, GLUCOSEU, HGBUR, BILIRUBINUR, KETONESUR, PROTEINUR, UROBILINOGEN, NITRITE, LEUKOCYTESUR  Radiological Exams on Admission: Dg Chest 1 View  Result Date: 12/11/2017 CLINICAL  DATA:  Recent fall with known left femoral fracture EXAM: CHEST  1 VIEW COMPARISON:  None. FINDINGS: Cardiac shadow is within normal limits. Postsurgical changes are seen. The second most superior sternal wire is fractured of uncertain chronicity. The lungs are clear bilaterally. No acute bony abnormality is noted. IMPRESSION: No acute abnormality noted. Electronically Signed   By: Inez Catalina M.D.   On: 12/11/2017 08:53   Dg Hip Unilat With Pelvis 2-3 Views Left  Result Date: 12/11/2017 CLINICAL DATA:  Fall yesterday with left hip pain, initial encounter EXAM: DG HIP (WITH OR WITHOUT PELVIS) 2-3V LEFT COMPARISON:  None. FINDINGS: Right hip prosthesis is noted. Pelvic ring is intact. No acute fracture or dislocation is noted. Degenerative changes of the left hip  joint are noted. No other focal abnormality is seen. IMPRESSION: Degenerative change without acute abnormality. Electronically Signed   By: Inez Catalina M.D.   On: 12/11/2017 08:50   Dg Femur Min 2 Views Left  Result Date: 12/11/2017 CLINICAL DATA:  Fall yesterday with left leg pain, initial encounter EXAM: LEFT FEMUR 2 VIEWS COMPARISON:  None. FINDINGS: Left knee prosthesis is noted in satisfactory position. Just above the knee prosthesis at the junction of the diaphysis and metaphysis in the distal left femur there is an oblique fracture identified. Some medial and posterior angulation of the distal fracture fragment is noted. No significant soft tissue abnormality is noted. IMPRESSION: Oblique fracture through the mid to distal left femur. Electronically Signed   By: Inez Catalina M.D.   On: 12/11/2017 08:52    EKG: Independently reviewed. Junctional rhythm, with RBBB.   Assessment/Plan Active Problems:   Femur fracture, left (HCC)    Left Femur fracture: Admit for surgical repair.  Orthopedics wants tthe patient to be transferred [facility] for [facility] repair in the morning.  In view of the patient's multiple medical problems, his age  and his h/o of CAD with CABG , pt is mod risk of developing CV complications.  Pain control.    Hypertension:  Well controlled.    Dementia:  No signs of agitation.  Resume home meds.    Hyperlipidemia:  Resume statin tonight.    Acute renal failure.  Pt basline hemoglobin around 1. Currently 1.35.  Suspect probably from dehydration.  Hydrate and repeat.   DVT prophylaxis:lovenox.  Code Status: full code.  Family Communication: family at [email or web address]  Disposition Plan:pending surgical repair.  Consults called: orthopedics  Admission status: inpatient/ medsurg   Hosie Poisson MD [facility] Pager [phone]  If 7PM-7AM, please contact night-coverage [email or web address] Password [facility]  12/11/2017, 10:58 AM

## 2017-12-11 NOTE — Plan of Care (Signed)
  Progressing Education: Knowledge of General Education information will improve 12/11/2017 1812 - Progressing by Rance Muir, RN Health Behavior/Discharge Planning: Ability to manage health-related needs will improve 12/11/2017 1812 - Progressing by Rance Muir, RN Clinical Measurements: Ability to maintain clinical measurements within normal limits will improve 12/11/2017 1812 - Progressing by Rance Muir, RN Will remain free from infection 12/11/2017 1812 - Progressing by Rance Muir, RN Diagnostic test results will improve 12/11/2017 1812 - Progressing by Rance Muir, RN Respiratory complications will improve 12/11/2017 1812 - Progressing by Rance Muir, RN Cardiovascular complication will be avoided 12/11/2017 1812 - Progressing by Rance Muir, RN Activity: Risk for activity intolerance will decrease 12/11/2017 1812 - Progressing by Rance Muir, RN Nutrition: Adequate nutrition will be maintained 12/11/2017 1812 - Progressing by Rance Muir, RN Coping: Level of anxiety will decrease 12/11/2017 1812 - Progressing by Rance Muir, RN Elimination: Will not experience complications related to bowel motility 12/11/2017 1812 - Progressing by Rance Muir, RN Will not experience complications related to urinary retention 12/11/2017 1812 - Progressing by Rance Muir, RN Pain Managment: General experience of comfort will improve 12/11/2017 1812 - Progressing by Rance Muir, RN Safety: Ability to remain free from injury will improve 12/11/2017 1812 - Progressing by Rance Muir, RN Skin Integrity: Risk for impaired skin integrity will decrease 12/11/2017 1812 - Progressing by Rance Muir, RN

## 2017-12-11 NOTE — ED Notes (Signed)
ED TO INPATIENT HANDOFF REPORT  Name/Age/Gender Cody Morris Born 84 y.o. male  Code Status Code Status History    This patient does not have a recorded code status. Please follow your organizational policy for patients in this situation.      Home/SNF/Other Home  Chief Complaint Fall  Level of Care/Admitting Diagnosis ED Disposition    ED Disposition Condition [facility] Area: [place] [100100]  Level of Care: Med-Surg [16]  Diagnosis: Femur fracture, left [facility]) [[id number]]  Admitting Physician: Hosie Poisson [[id number]]  Attending Physician: Hosie Poisson [[id number]]  Estimated length of stay: past midnight tomorrow  Certification:: I certify this patient will need inpatient services for at least 2 midnights  PT Class (Do Not Modify): Inpatient [101]  PT Acc Code (Do Not Modify): Private [1]       Medical History Past Medical History:  Diagnosis Date  . Anemia   . Arthritis   . CAD (coronary artery disease)   . Cataract   . Depression   . High blood pressure   . Hyperlipidemia   . Kidney stones 1998  . Skin cancer    face, ears  . Unspecified dementia without behavioral disturbance     Allergies No Known Allergies  IV Location/Drains/Wounds Patient Lines/Drains/Airways Status   Active Line/Drains/Airways    Name:   Placement date:   Placement time:   Site:   Days:   Peripheral IV 12/11/17 Right Arm   12/11/17    -    Arm   less than 1   Peripheral IV 12/11/17 Right Antecubital   12/11/17    0757    Antecubital   less than 1          Labs/Imaging Results for orders placed or performed during the hospital encounter of 12/11/17 (from the past 48 hour(s))  ABO/Rh     Status: None   Collection Time: 12/11/17  7:59 AM  Result Value Ref Range   ABO/RH(D)      A POS Performed at [facility], [place] [address], [place], [place] [address]   Type and screen [place]     Status: None   Collection  Time: 12/11/17  8:00 AM  Result Value Ref Range   ABO/RH(D) A POS    Antibody Screen NEG    Sample Expiration      12/14/2017 Performed at [facility], [place] [address], [place], [place] [address]   Basic metabolic panel     Status: Abnormal   Collection Time: 12/11/17  8:02 AM  Result Value Ref Range   Sodium 142 135 - 145 mmol/L   Potassium 3.9 3.5 - 5.1 mmol/L   Chloride 108 101 - 111 mmol/L   CO2 27 22 - 32 mmol/L   Glucose, Bld 106 (H) 65 - 99 mg/dL   BUN 21 (H) 6 - 20 mg/dL   Creatinine, Ser 1.35 (H) 0.61 - 1.24 mg/dL   Calcium 9.1 8.9 - 10.3 mg/dL   GFR calc non Af Amer 47 (L) >60 mL/min   GFR calc Af Amer 54 (L) >60 mL/min    Comment: (NOTE) The eGFR has been calculated using the CKD EPI equation. This calculation has not been validated in all clinical situations. eGFR's persistently <60 mL/min signify possible Chronic Kidney Disease.    Anion gap 7 5 - 15    Comment: Performed at [facility], [address] [place], [place]  [address]  CBC WITH DIFFERENTIAL     Status: Abnormal   Collection Time: 12/11/17  8:02 AM  Result Value Ref Range   WBC 10.8 (H) 4.0 - 10.5 K/uL   RBC 3.56 (L) 4.22 - 5.81 MIL/uL   Hemoglobin 12.1 (L) 13.0 - 17.0 g/dL   HCT 37.1 (L) 39.0 - 52.0 %   MCV 104.2 (H) 78.0 - 100.0 fL   MCH 34.0 26.0 - 34.0 pg   MCHC 32.6 30.0 - 36.0 g/dL   RDW 12.6 11.5 - 15.5 %   Platelets 179 150 - 400 K/uL   Neutrophils Relative % 78 %   Neutro Abs 8.4 (H) 1.7 - 7.7 K/uL   Lymphocytes Relative 12 %   Lymphs Abs 1.3 0.7 - 4.0 K/uL   Monocytes Relative 7 %   Monocytes Absolute 0.8 0.1 - 1.0 K/uL   Eosinophils Relative 3 %   Eosinophils Absolute 0.3 0.0 - 0.7 K/uL   Basophils Relative 0 %   Basophils Absolute 0.0 0.0 - 0.1 K/uL    Comment: Performed at [facility], [place] [address] [place], [place] [address]  Protime-INR     Status: None   Collection Time: 12/11/17  8:02 AM  Result Value Ref Range    Prothrombin Time 14.5 11.4 - 15.2 seconds   INR 1.14     Comment: Performed at [facility], [place] [address] [place],  [address]   Dg Chest 1 View  Result Date: 12/11/2017 CLINICAL DATA:  Recent fall with known left femoral fracture EXAM: CHEST  1 VIEW COMPARISON:  None. FINDINGS: Cardiac shadow is within normal limits. Postsurgical changes are seen. The second most superior sternal wire is fractured of uncertain chronicity. The lungs are clear bilaterally. No acute bony abnormality is noted. IMPRESSION: No acute abnormality noted. Electronically Signed   By: Inez Catalina M.D.   On: 12/11/2017 08:53   Dg Hip Unilat With Pelvis 2-3 Views Left  Result Date: 12/11/2017 CLINICAL DATA:  Fall yesterday with left hip pain, initial encounter EXAM: DG HIP (WITH OR WITHOUT PELVIS) 2-3V LEFT COMPARISON:  None. FINDINGS: Right hip prosthesis is noted. Pelvic ring is intact. No acute fracture or dislocation is noted. Degenerative changes of the left hip joint are noted. No other focal abnormality is seen. IMPRESSION: Degenerative change without acute abnormality. Electronically Signed   By: Inez Catalina M.D.   On: 12/11/2017 08:50   Dg Femur Min 2 Views Left  Result Date: 12/11/2017 CLINICAL DATA:  Fall yesterday with left leg pain, initial encounter EXAM: LEFT FEMUR 2 VIEWS COMPARISON:  None. FINDINGS: Left knee prosthesis is noted in satisfactory position. Just above the knee prosthesis at the junction of the diaphysis and metaphysis in the distal left femur there is an oblique fracture identified. Some medial and posterior angulation of the distal fracture fragment is noted. No significant soft tissue abnormality is noted. IMPRESSION: Oblique fracture through the mid to distal left femur. Electronically Signed   By: Inez Catalina M.D.   On: 12/11/2017 08:52    Pending Labs Unresulted Labs (From admission, onward)   Start     Ordered   Signed and Held  Creatinine, serum   (enoxaparin (LOVENOX)    CrCl >/= 30 ml/min)  Weekly,   R    Comments:  while on enoxaparin therapy    Signed and Held      Vitals/Pain Today's Vitals   12/11/17 1000 12/11/17 1030 12/11/17 1211 12/11/17 1319  BP: 128/86 132/74  124/74  Pulse: 66 75  85  Resp: _0 Temp:      TempSrc:      SpO2: 98% 98%  96%  Weight:   169 lb (76.7 kg)   Height:   _1  (1.778 m)     Isolation Precautions No active isolations  Medications Medications  ondansetron (ZOFRAN) injection 4 mg (4 mg Intravenous Given 12/11/17 0802)  0.9 %  sodium chloride infusion ( Intravenous New Bag/Given 12/11/17 1208)  morphine 2 MG/ML injection 1-2 mg (not administered)  morphine 4 MG/ML injection 4 mg (4 mg Intravenous Given 12/11/17 1208)    Mobility non-ambulatory EDH

## 2017-12-11 NOTE — ED Triage Notes (Signed)
Patient was walking to bathroom and fell. Patient landed on his left hip. Left femoral area is swollen. Patient does not know if hit his head but did not loose consciousness.

## 2017-12-11 NOTE — ED Provider Notes (Signed)
[facility] DEPT Provider Note   CSN: [id number] Arrival date & time: 12/11/17  3875     History   Chief Complaint Chief Complaint  Patient presents with  . Fall    HPI DUWANE GEWIRTZ is a 84 y.o. male.  HPI A 84 year old male presents the emergency department complaining of left hip and left leg pain after mechanical fall today in the house.  She denies head injury.  Denies neck pain.  Denies weakness of his arms or legs.  Presents with obvious deformity of his left lower leg with shortening and external rotation.  Patient has had a prior left knee arthroplasty and a right hip arthroplasty completed in [place] years ago.  He is recently relocated to the [place] region.  He is not on anticoagulants.  His pain is moderate in his left leg.   Past Medical History:  Diagnosis Date  . Anemia   . Arthritis   . CAD (coronary artery disease)   . Cataract   . Depression   . High blood pressure   . Hyperlipidemia   . Kidney stones 1998  . Skin cancer    face, ears  . Unspecified dementia without behavioral disturbance     Patient Active Problem List   Diagnosis Date Noted  . Dementia with behavioral disturbance 10/02/2017  . Hyperlipidemia   . CAD (coronary artery disease)   . High blood pressure   . Arthritis     Past Surgical History:  Procedure Laterality Date  . CORONARY ARTERY BYPASS GRAFT    . kidney stone removal    . open heart surgery    . quadruple bypass     about 22 years ago  . REPLACEMENT TOTAL KNEE Left   . stints     has 3, about 20 years ago  . TOTAL HIP ARTHROPLASTY Right    2008       Home Medications    Prior to Admission medications   Medication Sig Start Date End Date Taking? Authorizing Provider  acetaminophen (TYLENOL) 500 MG tablet Take 500 mg by mouth every 6 (six) hours as needed.    [provider]  aspirin EC 81 MG tablet Take 81 mg by mouth daily.    [provider]    benzonatate (TESSALON) 100 MG capsule Take 1 capsule (100 mg total) by mouth 3 (three) times daily as needed for cough. 10/12/17   [name], NP  donepezil (ARICEPT) 10 MG tablet Take 1 tablet (10 mg total) by mouth at bedtime. 10/02/17   [name], NP  memantine (NAMENDA) 10 MG tablet Take 1 tablet (10 mg total) by mouth 2 (two) times daily. 10/02/17   [name], NP  Omega-3 Fatty Acids (FISH OIL) 500 MG CAPS Take 1 capsule by mouth daily.    [provider]  simvastatin (ZOCOR) 10 MG tablet Take 1 tablet (10 mg total) by mouth daily. 10/02/17   [name], NP  vitamin B-12 (CYANOCOBALAMIN) 500 MCG tablet Take 500 mcg by mouth daily.    [provider]    Family History Family History  Problem Relation Age of Onset  . Stomach cancer Mother 68  . Esophageal cancer Mother 24  . Stroke Father     Social History Social History   Tobacco Use  . Smoking status: Never Smoker  . Smokeless tobacco: Never Used  Substance Use Topics  . Alcohol use: No    Frequency: Never  .  Drug use: No     Allergies   Patient has no known allergies.   Review of Systems Review of Systems  All other systems reviewed and are negative.    Physical Exam Updated Vital Signs BP (!) 148/74 (BP Location: Left Arm)   Pulse 63   Temp 98.1 F (36.7 C) (Oral)   Resp 15   SpO2 97%   Physical Exam  Constitutional: He is oriented to person, place, and time. He appears well-developed and well-nourished.  HENT:  Head: Normocephalic and atraumatic.  Eyes: EOM are normal.  Neck: Normal range of motion.  Cardiovascular: Normal rate, regular rhythm, normal heart sounds and intact distal pulses.  Pulmonary/Chest: Effort normal and breath sounds normal. No respiratory distress.  Abdominal: Soft. He exhibits no distension. There is no tenderness.  Musculoskeletal:  Painful range of motion of the left hip.  Normal pulses left foot.  Left thigh deformity and  swelling with tenderness concerning for left femur fracture.  Neurological: He is alert and oriented to person, place, and time.  Skin: Skin is warm and dry.  Psychiatric: He has a normal mood and affect. Judgment normal.  Nursing note and vitals reviewed.    ED Treatments / Results  Labs (all labs ordered are listed, but only abnormal results are displayed) Labs Reviewed  BASIC METABOLIC PANEL - Abnormal; Notable for the following components:      Result Value   Glucose, Bld 106 (*)    BUN 21 (*)    Creatinine, Ser 1.35 (*)    GFR calc non Af Amer 47 (*)    GFR calc Af Amer 54 (*)    All other components within normal limits  CBC WITH DIFFERENTIAL/PLATELET - Abnormal; Notable for the following components:   WBC 10.8 (*)    RBC 3.56 (*)    Hemoglobin 12.1 (*)    HCT 37.1 (*)    MCV 104.2 (*)    Neutro Abs 8.4 (*)    All other components within normal limits  PROTIME-INR  TYPE AND SCREEN  ABO/RH    EKG ECG interpretation   Date: 12/11/2017  Rate: 77  Rhythm: normal sinus rhythm  QRS Axis: normal  Intervals: normal  ST/T Wave abnormalities: normal  Conduction Disutrbances: RBBB  Narrative Interpretation:   Old EKG Reviewed: no prior available    Radiology Dg Chest 1 View  Result Date: 12/11/2017 CLINICAL DATA:  Recent fall with known left femoral fracture EXAM: CHEST  1 VIEW COMPARISON:  None. FINDINGS: Cardiac shadow is within normal limits. Postsurgical changes are seen. The second most superior sternal wire is fractured of uncertain chronicity. The lungs are clear bilaterally. No acute bony abnormality is noted. IMPRESSION: No acute abnormality noted. Electronically Signed   By: Inez Catalina M.D.   On: 12/11/2017 08:53   Dg Hip Unilat With Pelvis 2-3 Views Left  Result Date: 12/11/2017 CLINICAL DATA:  Fall yesterday with left hip pain, initial encounter EXAM: DG HIP (WITH OR WITHOUT PELVIS) 2-3V LEFT COMPARISON:  None. FINDINGS: Right hip prosthesis is noted.  Pelvic ring is intact. No acute fracture or dislocation is noted. Degenerative changes of the left hip joint are noted. No other focal abnormality is seen. IMPRESSION: Degenerative change without acute abnormality. Electronically Signed   By: Inez Catalina M.D.   On: 12/11/2017 08:50   Dg Femur Min 2 Views Left  Result Date: 12/11/2017 CLINICAL DATA:  Fall yesterday with left leg pain, initial encounter EXAM: LEFT FEMUR 2 VIEWS  COMPARISON:  None. FINDINGS: Left knee prosthesis is noted in satisfactory position. Just above the knee prosthesis at the junction of the diaphysis and metaphysis in the distal left femur there is an oblique fracture identified. Some medial and posterior angulation of the distal fracture fragment is noted. No significant soft tissue abnormality is noted. IMPRESSION: Oblique fracture through the mid to distal left femur. Electronically Signed   By: Inez Catalina M.D.   On: 12/11/2017 08:52    Procedures Reduction of fracture Performed by: Jola Schmidt, MD Authorized by: Jola Schmidt, MD     Reduction of Fracture Performed by: Jola Schmidt Consent: Verbal consent obtained. Risks and benefits: risks, benefits and alternatives were discussed Consent given by: patient Required items: required blood products, implants, devices, and special equipment available Time out: Immediately prior to procedure a "time out" was called to verify the correct patient, procedure, equipment, support staff and site/side marked as required.  Patient sedated: no  Vitals: Vital signs were monitored during sedation. Patient tolerance: Patient tolerated the procedure well with no immediate complications. BONE: Left femur Reduction technique: manipulation    SPLINT APPLICATION Authorized by: Jola Schmidt Consent: Verbal consent obtained. Risks and benefits: risks, benefits and alternatives were discussed Consent given by: patient Splint applied by: orthopedic technician Location  details: left leg Splint type: Knee immobilizer Supplies used: Knee immobilizer Post-procedure: The splinted body part was neurovascularly unchanged following the procedure. Patient tolerance: Patient tolerated the procedure well with no immediate complications.      Medications Ordered in ED Medications  morphine 4 MG/ML injection 4 mg (not administered)  ondansetron (ZOFRAN) injection 4 mg (not administered)     Initial Impression / Assessment and Plan / ED Course  I have reviewed the triage vital signs and the nursing notes.  Pertinent labs & imaging results that were available during my care of the patient were reviewed by me and considered in my medical decision making (see chart for details).     Patient with distal third left femur fracture.  This was reduced at the bedside which provided significant comfort for the patient.  Immobilized in a knee immobilizer.  Orthopedics consultation.  Patient will remain n.p.o.  No other injury.  Hospitalist admission.  Final Clinical Impressions(s) / ED Diagnoses   Diagnosis: Closed left femur fracture  ED Discharge Orders    None       Jola Schmidt, MD 12/11/17 1009

## 2017-12-11 NOTE — ED Notes (Signed)
Dr. Venora Maples stated to hold pt in the ED until [facility] consult, and/or until further notice. Pt possibly to be transferred to [facility].

## 2017-12-11 NOTE — ED Notes (Signed)
Called reports over to [phone]

## 2017-12-12 ENCOUNTER — Inpatient Hospital Stay (HOSPITAL_COMMUNITY): Payer: Medicare Other | Admitting: Anesthesiology

## 2017-12-12 ENCOUNTER — Encounter (HOSPITAL_COMMUNITY): Payer: Self-pay | Admitting: Anesthesiology

## 2017-12-12 ENCOUNTER — Encounter (HOSPITAL_COMMUNITY)
Admission: EM | Disposition: A | Discharge status: Skilled Nursing Facility | Payer: Self-pay | Source: Home / Self Care | Attending: Internal Medicine

## 2017-12-12 ENCOUNTER — Inpatient Hospital Stay (HOSPITAL_COMMUNITY): Payer: Medicare Other

## 2017-12-12 HISTORY — PX: ORIF FEMUR FRACTURE: SHX2119

## 2017-12-12 LAB — SURGICAL PCR SCREEN
MRSA, PCR: NEGATIVE
STAPHYLOCOCCUS AUREUS: NEGATIVE

## 2017-12-12 LAB — TYPE AND SCREEN
ABO/RH(D): A POS
ANTIBODY SCREEN: NEGATIVE

## 2017-12-12 LAB — ABO/RH: ABO/RH(D): A POS

## 2017-12-12 SURGERY — OPEN REDUCTION INTERNAL FIXATION (ORIF) DISTAL FEMUR FRACTURE
Anesthesia: Spinal | Site: Leg Upper | Laterality: Left

## 2017-12-12 MED ORDER — BACITRACIN ZINC 500 UNIT/GM EX OINT
TOPICAL_OINTMENT | CUTANEOUS | Status: AC
  Filled 2017-12-12: qty 28.35

## 2017-12-12 MED ORDER — SUGAMMADEX SODIUM 500 MG/5ML IV SOLN
INTRAVENOUS | Status: AC
  Filled 2017-12-12: qty 5

## 2017-12-12 MED ORDER — BUPIVACAINE IN DEXTROSE 0.75-8.25 % IT SOLN
INTRATHECAL | Status: DC | PRN
  Administered 2017-12-12: 2 mL via INTRATHECAL

## 2017-12-12 MED ORDER — MEPERIDINE HCL 50 MG/ML IJ SOLN: 6.2500 mg | INTRAMUSCULAR | Status: DC | PRN

## 2017-12-12 MED ORDER — ONDANSETRON HCL 4 MG/2ML IJ SOLN
INTRAMUSCULAR | Status: DC | PRN
  Administered 2017-12-12: 4 mg via INTRAVENOUS

## 2017-12-12 MED ORDER — LACTATED RINGERS IV SOLN
INTRAVENOUS | Status: DC | PRN
  Administered 2017-12-12: 14:00:00 via INTRAVENOUS

## 2017-12-12 MED ORDER — BACITRACIN ZINC 500 UNIT/GM EX OINT
TOPICAL_OINTMENT | CUTANEOUS | Status: DC | PRN
  Administered 2017-12-12: 1 via TOPICAL

## 2017-12-12 MED ORDER — PROPOFOL 10 MG/ML IV BOLUS
INTRAVENOUS | Status: AC
  Filled 2017-12-12: qty 20

## 2017-12-12 MED ORDER — LIDOCAINE HCL (CARDIAC) 20 MG/ML IV SOLN
INTRAVENOUS | Status: DC | PRN
  Administered 2017-12-12: 60 mg via INTRATRACHEAL

## 2017-12-12 MED ORDER — FENTANYL CITRATE (PF) 250 MCG/5ML IJ SOLN
INTRAMUSCULAR | Status: AC
  Filled 2017-12-12: qty 5

## 2017-12-12 MED ORDER — VANCOMYCIN HCL 1000 MG IV SOLR
INTRAVENOUS | Status: DC | PRN
  Administered 2017-12-12: 1000 mg

## 2017-12-12 MED ORDER — FENTANYL CITRATE (PF) 250 MCG/5ML IJ SOLN
INTRAMUSCULAR | Status: DC | PRN
  Administered 2017-12-12: 50 ug via INTRAVENOUS

## 2017-12-12 MED ORDER — CEFAZOLIN SODIUM-DEXTROSE 2-4 GM/100ML-% IV SOLN
2.0000 g | Freq: Three times a day (TID) | INTRAVENOUS | Status: AC
  Administered 2017-12-13 (×3): 2 g via INTRAVENOUS
  Filled 2017-12-12 (×3): qty 100

## 2017-12-12 MED ORDER — VANCOMYCIN HCL 1000 MG IV SOLR
INTRAVENOUS | Status: AC
  Filled 2017-12-12: qty 1000

## 2017-12-12 MED ORDER — PROPOFOL 500 MG/50ML IV EMUL
INTRAVENOUS | Status: DC | PRN
  Administered 2017-12-12: 20 ug/kg/min via INTRAVENOUS

## 2017-12-12 MED ORDER — PROPOFOL 10 MG/ML IV BOLUS
INTRAVENOUS | Status: DC | PRN
  Administered 2017-12-12: 20 mg via INTRAVENOUS

## 2017-12-12 MED ORDER — PHENYLEPHRINE HCL 10 MG/ML IJ SOLN
INTRAVENOUS | Status: DC | PRN
  Administered 2017-12-12: 20 ug/min via INTRAVENOUS

## 2017-12-12 MED ORDER — METHOCARBAMOL 1000 MG/10ML IJ SOLN
500.0000 mg | Freq: Three times a day (TID) | INTRAVENOUS | Status: DC | PRN
  Filled 2017-12-12: qty 5

## 2017-12-12 MED ORDER — 0.9 % SODIUM CHLORIDE (POUR BTL) OPTIME
TOPICAL | Status: DC | PRN
  Administered 2017-12-12: 1000 mL

## 2017-12-12 MED ORDER — FENTANYL CITRATE (PF) 100 MCG/2ML IJ SOLN: 25.0000 ug | INTRAMUSCULAR | Status: DC | PRN

## 2017-12-12 MED ORDER — DEXAMETHASONE SODIUM PHOSPHATE 4 MG/ML IJ SOLN
INTRAMUSCULAR | Status: DC | PRN
  Administered 2017-12-12: 4 mg via INTRAVENOUS

## 2017-12-12 MED ORDER — PHENYLEPHRINE HCL 10 MG/ML IJ SOLN
INTRAMUSCULAR | Status: DC | PRN
  Administered 2017-12-12: 80 ug via INTRAVENOUS
  Administered 2017-12-12: 120 ug via INTRAVENOUS
  Administered 2017-12-12: 40 ug via INTRAVENOUS

## 2017-12-12 SURGICAL SUPPLY — 65 items
BIT DRILL 4.3 (BIT) ×2
BIT DRILL 4.3MM (BIT) ×1
BIT DRILL 4.3X300MM (BIT) ×1 IMPLANT
BIT DRILL LONG 3.3 (BIT) ×4 IMPLANT
BIT DRILL LONG 3.3MM (BIT) ×2
BIT DRILL QC 3.3X195 (BIT) ×3 IMPLANT
BLADE CLIPPER SURG (BLADE) IMPLANT
BNDG COHESIVE 6X5 TAN STRL LF (GAUZE/BANDAGES/DRESSINGS) ×3 IMPLANT
BNDG ELASTIC 6X10 VLCR STRL LF (GAUZE/BANDAGES/DRESSINGS) ×3 IMPLANT
BRUSH SCRUB SURG 4.25 DISP (MISCELLANEOUS) ×6 IMPLANT
CANISTER SUCT 3000ML PPV (MISCELLANEOUS) ×3 IMPLANT
CAP LOCK NCB (Cap) ×24 IMPLANT
CHLORAPREP W/TINT 26ML (MISCELLANEOUS) ×3 IMPLANT
COVER SURGICAL LIGHT HANDLE (MISCELLANEOUS) ×3 IMPLANT
DRAPE C-ARM 42X72 X-RAY (DRAPES) ×3 IMPLANT
DRAPE C-ARMOR (DRAPES) ×3 IMPLANT
DRAPE ORTHO SPLIT 77X108 STRL (DRAPES) ×4
DRAPE SURG 17X23 STRL (DRAPES) ×3 IMPLANT
DRAPE SURG ORHT 6 SPLT 77X108 (DRAPES) ×2 IMPLANT
DRAPE U-SHAPE 47X51 STRL (DRAPES) ×3 IMPLANT
DRSG ADAPTIC 3X8 NADH LF (GAUZE/BANDAGES/DRESSINGS) ×3 IMPLANT
DRSG MEPILEX BORDER 4X12 (GAUZE/BANDAGES/DRESSINGS) IMPLANT
DRSG MEPILEX BORDER 4X4 (GAUZE/BANDAGES/DRESSINGS) IMPLANT
DRSG MEPILEX BORDER 4X8 (GAUZE/BANDAGES/DRESSINGS) ×3 IMPLANT
DRSG PAD ABDOMINAL 8X10 ST (GAUZE/BANDAGES/DRESSINGS) ×12 IMPLANT
ELECT REM PT RETURN 9FT ADLT (ELECTROSURGICAL) ×3
ELECTRODE REM PT RTRN 9FT ADLT (ELECTROSURGICAL) ×1 IMPLANT
GAUZE SPONGE 4X4 12PLY STRL (GAUZE/BANDAGES/DRESSINGS) ×3 IMPLANT
GLOVE BIO SURGEON STRL SZ7.5 (GLOVE) ×12 IMPLANT
GLOVE BIOGEL PI IND STRL 7.5 (GLOVE) ×1 IMPLANT
GLOVE BIOGEL PI INDICATOR 7.5 (GLOVE) ×2
GOWN STRL REUS W/ TWL LRG LVL3 (GOWN DISPOSABLE) ×3 IMPLANT
GOWN STRL REUS W/TWL LRG LVL3 (GOWN DISPOSABLE) ×6
K-WIRE 2.0 (WIRE) ×6
K-WIRE FXSTD 280X2XNS SS (WIRE) ×3
KIT BASIN OR (CUSTOM PROCEDURE TRAY) ×3 IMPLANT
KIT ROOM TURNOVER OR (KITS) ×3 IMPLANT
KWIRE FXSTD 280X2XNS SS (WIRE) ×3 IMPLANT
NS IRRIG 1000ML POUR BTL (IV SOLUTION) ×3 IMPLANT
PACK TOTAL JOINT (CUSTOM PROCEDURE TRAY) ×3 IMPLANT
PAD ARMBOARD 7.5X6 YLW CONV (MISCELLANEOUS) ×6 IMPLANT
PAD CAST 4YDX4 CTTN HI CHSV (CAST SUPPLIES) ×1 IMPLANT
PADDING CAST COTTON 4X4 STRL (CAST SUPPLIES) ×2
PADDING CAST COTTON 6X4 STRL (CAST SUPPLIES) ×3 IMPLANT
PLATE DIST FEM 12H (Plate) ×3 IMPLANT
SCREW 5.0 80MM (Screw) ×6 IMPLANT
SCREW CORTICAL NCB 5.0X40 (Screw) ×6 IMPLANT
SCREW NCB 3.5X75X5X6.2XST (Screw) ×1 IMPLANT
SCREW NCB 4.0X40MM (Screw) ×3 IMPLANT
SCREW NCB 5.0X75MM (Screw) ×2 IMPLANT
SCREW NCB 5.0X85MM (Screw) ×9 IMPLANT
SPONGE LAP 18X18 X RAY DECT (DISPOSABLE) ×3 IMPLANT
STAPLER VISISTAT 35W (STAPLE) ×3 IMPLANT
SUCTION FRAZIER HANDLE 10FR (MISCELLANEOUS) ×2
SUCTION TUBE FRAZIER 10FR DISP (MISCELLANEOUS) ×1 IMPLANT
SUT ETHILON 3 0 PS 1 (SUTURE) ×9 IMPLANT
SUT VIC AB 0 CT1 27 (SUTURE) ×2
SUT VIC AB 0 CT1 27XBRD ANBCTR (SUTURE) ×1 IMPLANT
SUT VIC AB 1 CT1 27 (SUTURE)
SUT VIC AB 1 CT1 27XBRD ANBCTR (SUTURE) IMPLANT
SUT VIC AB 2-0 CT1 27 (SUTURE) ×4
SUT VIC AB 2-0 CT1 TAPERPNT 27 (SUTURE) ×2 IMPLANT
TOWEL OR 17X26 10 PK STRL BLUE (TOWEL DISPOSABLE) ×6 IMPLANT
TRAY FOLEY W/METER SILVER 16FR (SET/KITS/TRAYS/PACK) IMPLANT
WATER STERILE IRR 1000ML POUR (IV SOLUTION) ×6 IMPLANT

## 2017-12-12 NOTE — Anesthesia Procedure Notes (Addendum)
Spinal  Patient location during procedure: OR Start time: 12/12/2017 4:02 PM Staffing Anesthesiologist: Josephine Igo, MD Performed: anesthesiologist  Preanesthetic Checklist Completed: patient identified, site marked, surgical consent, pre-op evaluation, timeout performed, IV checked, risks and benefits discussed and monitors and equipment checked Spinal Block Patient position: left lateral decubitus Prep: site prepped and draped and DuraPrep Patient monitoring: heart rate, cardiac monitor, continuous pulse ox and blood pressure Approach: midline Location: L3-4 Injection technique: single-shot Needle Needle type: Pencan  Needle gauge: 24 G Needle length: 9 cm Needle insertion depth: 7 cm Assessment Sensory level: T8 Additional Notes Patient tolerated procedure well. Adequate sensory level.

## 2017-12-12 NOTE — Plan of Care (Signed)
  Nutrition: Adequate nutrition will be maintained 12/12/2017 1113 - Progressing by Williams Che, RN   Coping: Level of anxiety will decrease 12/12/2017 1113 - Progressing by Williams Che, RN   Elimination: Will not experience complications related to bowel motility 12/12/2017 1113 - Progressing by Williams Che, RN   Pain Managment: General experience of comfort will improve 12/12/2017 1113 - Progressing by Williams Che, RN   Safety: Ability to remain free from injury will improve 12/12/2017 1113 - Progressing by Williams Che, RN   Skin Integrity: Risk for impaired skin integrity will decrease 12/12/2017 1113 - Progressing by Williams Che, RN

## 2017-12-12 NOTE — Anesthesia Postprocedure Evaluation (Signed)
Anesthesia Post Note  Patient: Cody Morris  Procedure(s) Performed: OPEN REDUCTION INTERNAL FIXATION (ORIF) DISTAL FEMUR FRACTURE (Left Leg Upper)     Patient location during evaluation: PACU Anesthesia Type: Spinal Level of consciousness: awake Pain management: pain level controlled Respiratory status: spontaneous breathing Cardiovascular status: stable    Last Vitals:  Vitals:   12/12/17 0407 12/12/17 1300  BP: 121/62 (!) 143/67  Pulse: 87 88  Resp: 16 16  Temp: 36.4 C 36.7 C  SpO2: 98% 97%    Last Pain:  Vitals:   12/12/17 1300  TempSrc: Axillary  PainSc:                  [name]

## 2017-12-12 NOTE — Progress Notes (Signed)
Patient has severe dementia. Patient's wife and daughter both state that the patient has yellow DNR form and will bring it in tomorrow. Notified Baltazar Najjar, NP on call.

## 2017-12-12 NOTE — Anesthesia Preprocedure Evaluation (Addendum)
Anesthesia Evaluation  Patient identified by MRN, date of birth, ID band Patient awake and Patient confused    Reviewed: Allergy & Precautions, NPO status , Patient's Chart, lab work & pertinent test results  Airway Mallampati: II  TM Distance: >3 FB Neck ROM: Full    Dental  (+) Upper Dentures   Pulmonary neg pulmonary ROS,    breath sounds clear to auscultation + decreased breath sounds      Cardiovascular hypertension, Pt. on medications + CAD, + Cardiac Stents and + CABG  Normal cardiovascular exam Rhythm:Regular Rate:Normal     Neuro/Psych PSYCHIATRIC DISORDERS Depression Dementia negative neurological ROS     GI/Hepatic negative GI ROS, Neg liver ROS,   Endo/Other  negative endocrine ROS  Renal/GU Renal diseaseHx/o renal calculi  negative genitourinary   Musculoskeletal  (+) Arthritis , Osteoarthritis,  Left distal femur Fx   Abdominal   Peds  Hematology negative hematology ROS (+) anemia ,   Anesthesia Other Findings   Reproductive/Obstetrics                          Anesthesia Physical Anesthesia Plan  ASA: III  Anesthesia Plan: Spinal   Post-op Pain Management:    Induction:   PONV Risk Score and Plan: 2 and Propofol infusion, Ondansetron, Dexamethasone and Treatment may vary due to age or medical condition  Airway Management Planned:   Additional Equipment:   Intra-op Plan:   Post-operative Plan:   Informed Consent: I have reviewed the patients History and Physical, chart, labs and discussed the procedure including the risks, benefits and alternatives for the proposed anesthesia with the patient or authorized representative who has indicated his/her understanding and acceptance.     Plan Discussed with: CRNA, Anesthesiologist and Surgeon  Anesthesia Plan Comments:         Anesthesia Quick Evaluation

## 2017-12-12 NOTE — Progress Notes (Addendum)
[facility] Progress Note  Patient: Cody Morris:[id number]   PCP: [name], NP DOB: Oct 11, 1931   DOA: 12/11/2017   DOS: 12/12/2017   Date of Service: the patient was seen and examined on 12/12/2017  Subjective: Has occasional muscle cramps.  No nausea no vomiting.  Breathing better.  No chest pain no headache.  No neck pain.  No other focal deficit.  Brief hospital course: Pt. with PMH of dementia, coronary artery disease S/P CABG, prior hip fracture; admitted on 12/11/2017, presented with complaint of fall, was found to have distal femur fracture. Currently further plan is await operative repair and monitor postop course.  Assessment and Plan: 1.  Mechanical fall. PT OT consulted.  Denies any head injury no focal deficit on exam.  2.  Distal right periprosthetic femur fracture. Orthopedic consulted, patient is for ORIF today at with Dr. Doreatha Martin. N.p.o. for now. Morphine. As needed Robaxin added for muscle cramps. Patient will likely be nonweightbearing for 6-8 weeks and will need SNF placement. Social worker consulted. Family informed. Monitor postoperative course.  3.  Preop clearance. History of coronary artery disease without any recent cardiovascular event puts the patient at moderate risk for any abnormal cardiovascular outcome. Not prohibitive. Family understand the risk. Biggest issue is patient's dementia can lead to significant delirium postoperatively. Will monitor for now.  4.  Coronary artery disease. Ejection fraction January 20 55%. No significant valvular abnormality seen at that point as well. Stress test December 12 low risk. Continue 81 mg aspirin, simvastatin.  5.  Essential hypertension. Patient on atenolol at home, blood pressure well controlled.  Currently holding.  Monitor.  6.  Dementia. Alzheimer's disease continue home medication. Monitor for delirium postoperatively.  Diet: N.p.o. at present cardiac diet postoperatively  depending on recommendation from orthopedics. DVT Prophylaxis: subcutaneous Heparin  Advance goals of care discussion: FULL CODE  Family Communication: family was present at bedside, at the time of interview. The pt provided permission to discuss medical plan with the family. Opportunity was given to ask question and all questions were answered satisfactorily.   Disposition:  Discharge to SNF.  Consultants: orthopedics Procedures: none  Antibiotics: Anti-infectives (From admission, onward)   Start     Dose/Rate Route Frequency Ordered Stop   12/12/17 1347  ceFAZolin (ANCEF) IVPB 2g/100 mL premix     2 g 200 mL/hr over 30 Minutes Intravenous To ShortStay Surgical 12/11/17 1658 12/13/17 1400       Objective: Physical Exam: Vitals:   12/11/17 1500 12/11/17 1530 12/11/17 2027 12/12/17 0407  BP: 115/67 127/72 111/60 121/62  Pulse: 81 87 91 87  Resp: 16 14 15 16   Temp:   97.6 F (36.4 C) 97.6 F (36.4 C)  TempSrc:   Axillary Axillary  SpO2: 95% 94% 96% 98%  Weight:      Height:        Intake/Output Summary (Last 24 hours) at 12/12/2017 0940 Last data filed at 12/12/2017 0410 Gross per 24 hour  Intake -  Output 550 ml  Net -550 ml   Filed Weights   12/11/17 1211  Weight: 76.7 kg (169 lb)   General: Alert, Awake and Oriented to Place and Person. Appear in mild distress, affect appropriate Eyes: PERRL, Conjunctiva normal ENT: Oral Mucosa clear moist. Neck: no JVD, no Abnormal Mass Or lumps Cardiovascular: S1 and S2 Present, no Murmur, Peripheral Pulses Present Respiratory: normal respiratory effort, Bilateral Air entry equal and Decreased, no use of accessory muscle, Clear to Auscultation, no  Crackles, no wheezes Abdomen: Bowel Sound present, Soft and no tenderness, no hernia Skin: no redness, no Rash, no induration Extremities: no Pedal edema, no calf tenderness Neurologic: Grossly no focal neuro deficit. Bilaterally Equal motor strength  Data Reviewed: CBC: Recent  Labs  Lab 12/11/17 0802  WBC 10.8*  NEUTROABS 8.4*  HGB 12.1*  HCT 37.1*  MCV 104.2*  PLT 762   Basic Metabolic Panel: Recent Labs  Lab 12/11/17 0802  NA 142  K 3.9  CL 108  CO2 27  GLUCOSE 106*  BUN 21*  CREATININE 1.35*  CALCIUM 9.1    Liver Function Tests: No results for input(s): AST, ALT, ALKPHOS, BILITOT, PROT, ALBUMIN in the last 168 hours. No results for input(s): LIPASE, AMYLASE in the last 168 hours. No results for input(s): AMMONIA in the last 168 hours. Coagulation Profile: Recent Labs  Lab 12/11/17 0802  INR 1.14   Cardiac Enzymes: No results for input(s): CKTOTAL, CKMB, CKMBINDEX, TROPONINI in the last 168 hours. BNP (last 3 results) No results for input(s): PROBNP in the last 8760 hours. CBG: No results for input(s): GLUCAP in the last 168 hours. Studies: No results found.  Scheduled Meds: . chlorhexidine  60 mL Topical Once  . donepezil  10 mg Oral QHS  . enoxaparin (LOVENOX) injection  40 mg Subcutaneous Q24H  . memantine  10 mg Oral BID  . povidone-iodine  2 application Topical Once   Continuous Infusions: . sodium chloride 75 mL/hr at 12/11/17 1208  .  ceFAZolin (ANCEF) IV    . methocarbamol (ROBAXIN)  IV     PRN Meds: acetaminophen, methocarbamol (ROBAXIN)  IV, morphine injection, ondansetron (ZOFRAN) IV  Time spent: 35 minutes  Author: Berle Mull, MD [facility] Pager: [phone] 12/12/2017 9:40 AM  If 7PM-7AM, please contact night-coverage at [email or web address], password [facility]

## 2017-12-12 NOTE — Transfer of Care (Signed)
Immediate Anesthesia Transfer of Care Note  Patient: Cody Morris  Procedure(s) Performed: OPEN REDUCTION INTERNAL FIXATION (ORIF) DISTAL FEMUR FRACTURE (Left Leg Upper)  Patient Location: PACU  Anesthesia Type:Spinal  Level of Consciousness: awake  Airway & Oxygen Therapy: Patient connected to nasal cannula oxygen  Post-op Assessment: Report given to RN  Post vital signs: Reviewed and stable  Last Vitals:  Vitals:   12/12/17 0407 12/12/17 1300  BP: 121/62 (!) 143/67  Pulse: 87 88  Resp: 16 16  Temp: 36.4 C 36.7 C  SpO2: 98% 97%    Last Pain:  Vitals:   12/12/17 1300  TempSrc: Axillary  PainSc:       Patients Stated Pain Goal: 3 (62/13/08 6578)  Complications: No apparent anesthesia complications

## 2017-12-12 NOTE — Op Note (Signed)
OrthopaedicSurgeryOperativeNote [phone]) Date of Surgery: 12/12/2017  Admit Date: 12/11/2017   Diagnoses: Pre-Op Diagnoses: Left periprosthetic femur fracture  Post-Op Diagnosis: Same  Procedures: CPT 27511-ORIF left supracondylar femur fracture  Surgeons: Primary: Shona Needles, MD   Location:[facility] OR ROOM 07   AnesthesiaGeneral   Antibiotics:Ancef 2g preop   Tourniquettime:* No tourniquets in log * .  [id number]:17 mL   Complications:None  Specimens:None  Implants: Implant Name Type Inv. Item Serial No. Manufacturer Lot No. LRB No. Used Action  SCREW 5.0 80MM - [id number] Screw SCREW 5.0 80MM  [employer](ORTH,TRAU,BIO,SG)  Left 2 Implanted  SCREW NCB 5.0X85MM - [id number] Screw SCREW NCB 5.0X85MM  [employer](ORTH,TRAU,BIO,SG)  Left 3 Implanted  SCREW NCB 5.0X75MM - [id number] Screw SCREW NCB 5.0X75MM  [employer](ORTH,TRAU,BIO,SG)  Left 1 Implanted  PLATE DIST FEM 27X - [id number] Plate PLATE DIST FEM 72C  [employer](ORTH,TRAU,BIO,SG)  Left 1 Implanted  SCREW CORTICAL NCB 5.0X40 - [id number] Screw SCREW CORTICAL NCB 5.0X40  [employer](ORTH,TRAU,BIO,SG)  Left 2 Implanted  SCREW NCB 4.0X40MM - [id number] Screw SCREW NCB 4.0X40MM  [employer](ORTH,TRAU,BIO,SG)  Left 1 Implanted  CAP LOCK NCB - [id number] Cap CAP LOCK NCB  [employer](ORTH,TRAU,BIO,SG)  Left 8 Implanted    IndicationsforSurgery: 84 year old male who had a fall and sustained a left periprosthetic femur fracture.  Felt that proceeding with operative fixation was the most appropriate to allow mobilization and weightbearing.  Risks and benefits were discussed with the patient's family.  They agreed to proceed with surgery.  Risks discussed included bleeding requiring blood transfusion, bleeding causing a hematoma, infection, malunion, nonunion, damage to surrounding nerves and blood vessels, pain, hardware prominence or irritation, hardware failure, stiffness, post-traumatic  arthritis, DVT/PE, compartment syndrome, and even death. Consent was obtained  Operative Findings: Open reduction internal fixation left femur fracture using [employer] distal NCB periprosthetic femur plate 12 hole.  Procedure: The patient was identified in the preoperative holding area. Consent was confirmed with the patient and their family and all questions were answered. The operative extremity was marked after confirmation with the family. The patientwas then brought back to the operating room by our anesthesia colleagues. The patient was placed under general anesthesia and was carefully transferred over to a radiolucent flattop table. They were secured to the bed and all bony prominences were padded.  A bump was placed under the operative hip. The operative extremity was then prepped and draped in usual sterile fashion. A timeout was performed to verify the patient, the procedure and extremity. Preoperative antibiotics were dosed.  Fluoroscopy was used to identify the fracture. A lateral approach was made to the distal femur. It was taken down through the skin and the IT band was incised in line with the incision. The distal portion of the vastus lateralis was reflected off the IT band and the distal articular block of the lateral femoral condyle was exposed.  An attempt was made to keep the fracture from being stripped or devitalized.  I exposed the distal extent of the fracture in manipulate the fracture into reduction.  I used a bone hook across the anterior portion of the femur to pull the distal segment laterally.  Once I had reduction I then held it provisionally with anterior-lateral to posterior-medial placed K wires.  I used fluoroscopy to identify the correct length plate to bridge the whole femoral shaft. I chose a 12-hole plate. The aiming arm was attached to the plate and slide submuscularly under the vastus to the proximal femur. The distal  portion of the plate was placed flush  against the lateral cortex of the distal articular block. A 3.42mm drill bit was used to position the proximal portion of the plate. A perfect lateral was obtained to show appropriate positioning of the plate.  The distal segment was drilled and 5.42mm cortical screws were placed in the distal fracture segment. A total of 3 screws were placed initially. A 5.0 mm screw was placed percutaneously in the shaft to hold the reduction of the coronal alignment. A total of three shaft screws were placed. Locking caps were placed in the shaft screws except for the most proximal screw to prevent a stress riser. The targeting guide was removed. Three more screws were placed in the distal segment. Fluoroscopy was used to confirm adequate reduction of the fracture and appropriate position of the plate and screws.  The incisions were irrigated with normal saline. A gram of vancomycin powder was placed in the incision around the plate. The IT band was closed with 0-vicryl. The skin was closed with 2-vicryl, 3-0 nylon. The incisions were dressed with a Mepilex dressing and bactracin ointment, adaptec, 4x4s and sterile cast padding and the leg was wrapped with a ACE wrap. The patient was then transferred to the regular floor bed and taken to the PACU in stable condition.  Post Op Plan/Instructions: The patient will be weightbearing as tolerated.  He will continue with Lovenox for DVT prophylaxis.  He will receive postoperative Ancef.  I was present and performed the entire surgery.  Katha Hamming, MD [facility]

## 2017-12-12 NOTE — Consult Note (Signed)
Reason for Consult:Right periprosthetic distal femur fx Referring Physician: P Brendan Gadson is an 84 y.o. male with CAD s/p CABG, dyslipidemia, OA, HTN, and dementia. HPI: Cody Morris was at home when he lost his balance and fell backwards onto his left side. He had immediate pain and could not get up. He was brought to [facility] ED where x-rays showed a left distal periprosthetic femur fx and orthopedic surgery was consulted. He normally walks independently at home but his wife is usually by his side while he does so. He had moderate dementia but is very pleasant.  Past Medical History:  Diagnosis Date  . Anemia   . Arthritis   . CAD (coronary artery disease)   . Cataract   . Depression   . High blood pressure   . Hyperlipidemia   . Kidney stones 1998  . Skin cancer    face, ears  . Unspecified dementia without behavioral disturbance     Past Surgical History:  Procedure Laterality Date  . CORONARY ARTERY BYPASS GRAFT    . kidney stone removal    . open heart surgery    . quadruple bypass     about 22 years ago  . REPLACEMENT TOTAL KNEE Left   . stints     has 3, about 20 years ago  . TOTAL HIP ARTHROPLASTY Right    2008    Family History  Problem Relation Age of Onset  . Stomach cancer Mother 52  . Esophageal cancer Mother 62  . Stroke Father     Social History:  reports that  has never smoked. he has never used smokeless tobacco. He reports that he does not drink alcohol or use drugs.  Allergies: No Known Allergies  Medications: I have reviewed the patient's current medications.  Results for orders placed or performed during the hospital encounter of 12/11/17 (from the past 48 hour(s))  ABO/Rh     Status: None   Collection Time: 12/11/17  7:59 AM  Result Value Ref Range   ABO/RH(D)      A POS Performed at [facility], [place] [address], [place], [place] [address]   Type and screen [place]     Status: None   Collection  Time: 12/11/17  8:00 AM  Result Value Ref Range   ABO/RH(D) A POS    Antibody Screen NEG    Sample Expiration      12/14/2017 Performed at [facility], [place] [address], [place], [place] [address]   Basic metabolic panel     Status: Abnormal   Collection Time: 12/11/17  8:02 AM  Result Value Ref Range   Sodium 142 135 - 145 mmol/L   Potassium 3.9 3.5 - 5.1 mmol/L   Chloride 108 101 - 111 mmol/L   CO2 27 22 - 32 mmol/L   Glucose, Bld 106 (H) 65 - 99 mg/dL   BUN 21 (H) 6 - 20 mg/dL   Creatinine, Ser 1.35 (H) 0.61 - 1.24 mg/dL   Calcium 9.1 8.9 - 10.3 mg/dL   GFR calc non Af Amer 47 (L) >60 mL/min   GFR calc Af Amer 54 (L) >60 mL/min    Comment: (NOTE) The eGFR has been calculated using the CKD EPI equation. This calculation has not been validated in all clinical situations. eGFR's persistently <60 mL/min signify possible Chronic Kidney Disease.    Anion gap 7 5 - 15    Comment: Performed at [facility], [place] [address], [place], [place] [address]  CBC WITH DIFFERENTIAL     Status: Abnormal   Collection Time: 12/11/17  8:02 AM  Result Value Ref Range   WBC 10.8 (H) 4.0 - 10.5 K/uL   RBC 3.56 (L) 4.22 - 5.81 MIL/uL   Hemoglobin 12.1 (L) 13.0 - 17.0 g/dL   HCT 37.1 (L) 39.0 - 52.0 %   MCV 104.2 (H) 78.0 - 100.0 fL   MCH 34.0 26.0 - 34.0 pg   MCHC 32.6 30.0 - 36.0 g/dL   RDW 12.6 11.5 - 15.5 %   Platelets 179 150 - 400 K/uL   Neutrophils Relative % 78 %   Neutro Abs 8.4 (H) 1.7 - 7.7 K/uL   Lymphocytes Relative 12 %   Lymphs Abs 1.3 0.7 - 4.0 K/uL   Monocytes Relative 7 %   Monocytes Absolute 0.8 0.1 - 1.0 K/uL   Eosinophils Relative 3 %   Eosinophils Absolute 0.3 0.0 - 0.7 K/uL   Basophils Relative 0 %   Basophils Absolute 0.0 0.0 - 0.1 K/uL    Comment: Performed at [facility], [place] [address] [place], [place] [address]  Protime-INR     Status: None   Collection Time: 12/11/17  8:02 AM  Result Value Ref Range    Prothrombin Time 14.5 11.4 - 15.2 seconds   INR 1.14     Comment: Performed at [facility], [place] [address] [place], [place] [address]  Type and screen [place]     Status: None   Collection Time: 12/11/17 11:34 PM  Result Value Ref Range   ABO/RH(D) A POS    Antibody Screen NEG    Sample Expiration      12/14/2017 Performed at [facility], [place] [address], [place], [place] [address]   ABO/Rh     Status: None   Collection Time: 12/11/17 11:34 PM  Result Value Ref Range   ABO/RH(D)      A POS Performed at [place] [address] [place], [place] [address]     Dg Chest 1 View  Result Date: 12/11/2017 CLINICAL DATA:  Recent fall with known left femoral fracture EXAM: CHEST  1 VIEW COMPARISON:  None. FINDINGS: Cardiac shadow is within normal limits. Postsurgical changes are seen. The second most superior sternal wire is fractured of uncertain chronicity. The lungs are clear bilaterally. No acute bony abnormality is noted. IMPRESSION: No acute abnormality noted. Electronically Signed   By: Inez Catalina M.D.   On: 12/11/2017 08:53   Dg Hip Unilat With Pelvis 2-3 Views Left  Result Date: 12/11/2017 CLINICAL DATA:  Fall yesterday with left hip pain, initial encounter EXAM: DG HIP (WITH OR WITHOUT PELVIS) 2-3V LEFT COMPARISON:  None. FINDINGS: Right hip prosthesis is noted. Pelvic ring is intact. No acute fracture or dislocation is noted. Degenerative changes of the left hip joint are noted. No other focal abnormality is seen. IMPRESSION: Degenerative change without acute abnormality. Electronically Signed   By: Inez Catalina M.D.   On: 12/11/2017 08:50   Dg Femur Min 2 Views Left  Result Date: 12/11/2017 CLINICAL DATA:  Fall yesterday with left leg pain, initial encounter EXAM: LEFT FEMUR 2 VIEWS COMPARISON:  None. FINDINGS: Left knee prosthesis is noted in satisfactory position. Just above the knee prosthesis at the junction of the  diaphysis and metaphysis in the distal left femur there is an oblique fracture identified. Some medial and posterior  angulation of the distal fracture fragment is noted. No significant soft tissue abnormality is noted. IMPRESSION: Oblique fracture through the mid to distal left femur. Electronically Signed   By: Inez Catalina M.D.   On: 12/11/2017 08:52    Review of Systems  Unable to perform ROS: Dementia  Musculoskeletal: Positive for joint pain (left thigh).   Blood pressure 121/62, pulse 87, temperature 97.6 F (36.4 C), temperature source Axillary, resp. rate 16, height 5' 10" (1.778 m), weight 76.7 kg (169 lb), SpO2 98 %. Physical Exam  Constitutional: He appears well-developed and well-nourished. No distress.  HENT:  Head: Normocephalic and atraumatic.  Eyes: Conjunctivae are normal. Right eye exhibits no discharge. Left eye exhibits no discharge. No scleral icterus.  Neck: Normal range of motion.  Cardiovascular: Normal rate and regular rhythm.  Respiratory: Effort normal. No respiratory distress.  Musculoskeletal:  LLE No traumatic wounds, ecchymosis, or rash  KI in place, moderate pain with even small manipulation of leg  No knee or ankle effusion  Sens DPN, SPN, TN could not reliably assess  Motor EHL, ext, flex, evers 5/5 grossly  DP 2+, PT 1+, 1+ NP edema  Neurological: He is alert.  Skin: Skin is warm and dry. He is not diaphoretic.  Psychiatric: He has a normal mood and affect. His behavior is normal.    Assessment/Plan: Fall Right distal periprosthetic femur fx -- For ORIF today by Dr. Doreatha Martin. Please keep NPO until then. Will likely be NWB post-operatively for 6-8 weeks and will need SNF placement. Multiple medical problems -- per primary team    Lisette Abu, PA-C Orthopedic Surgery [phone] 12/12/2017, 8:18 AM

## 2017-12-13 ENCOUNTER — Inpatient Hospital Stay: Admit: 2017-12-13 | Payer: Medicare Other | Admitting: Orthopedic Surgery

## 2017-12-13 ENCOUNTER — Encounter (HOSPITAL_COMMUNITY): Payer: Self-pay | Admitting: Student

## 2017-12-13 DIAGNOSIS — S72402A Unspecified fracture of lower end of left femur, initial encounter for closed fracture: Secondary | ICD-10-CM

## 2017-12-13 LAB — BASIC METABOLIC PANEL
ANION GAP: 10 (ref 5–15)
BUN: 20 mg/dL (ref 6–20)
CALCIUM: 8.4 mg/dL — AB (ref 8.9–10.3)
CO2: 24 mmol/L (ref 22–32)
Chloride: 107 mmol/L (ref 101–111)
Creatinine, Ser: 1.37 mg/dL — ABNORMAL HIGH (ref 0.61–1.24)
GFR, EST AFRICAN AMERICAN: 53 mL/min — AB (ref >60)
GFR, EST NON AFRICAN AMERICAN: 46 mL/min — AB (ref >60)
GLUCOSE: 160 mg/dL — AB (ref 65–99)
POTASSIUM: 4 mmol/L (ref 3.5–5.1)
SODIUM: 141 mmol/L (ref 135–145)

## 2017-12-13 LAB — CBC
HCT: 27.3 % — ABNORMAL LOW (ref 39.0–52.0)
Hemoglobin: 8.7 g/dL — ABNORMAL LOW (ref 13.0–17.0)
MCH: 33.2 pg (ref 26.0–34.0)
MCHC: 31.9 g/dL (ref 30.0–36.0)
MCV: 104.2 fL — AB (ref 78.0–100.0)
Platelets: 140 10*3/uL — ABNORMAL LOW (ref 150–400)
RBC: 2.62 MIL/uL — AB (ref 4.22–5.81)
RDW: 12.7 % (ref 11.5–15.5)
WBC: 10.2 10*3/uL (ref 4.0–10.5)

## 2017-12-13 SURGERY — OPEN REDUCTION INTERNAL FIXATION (ORIF) PERIPROSTHETIC FRACTURE
Anesthesia: General | Laterality: Left

## 2017-12-13 MED ORDER — METHOCARBAMOL 500 MG PO TABS
500.0000 mg | ORAL_TABLET | Freq: Four times a day (QID) | ORAL | Status: DC | PRN
Start: 2017-12-13 — End: 2017-12-14

## 2017-12-13 MED ORDER — FE FUMARATE-B12-VIT C-FA-IFC PO CAPS
1.0000 | ORAL_CAPSULE | Freq: Two times a day (BID) | ORAL | Status: DC
  Administered 2017-12-13 – 2017-12-14 (×3): 1 via ORAL
  Filled 2017-12-13 (×4): qty 1

## 2017-12-13 MED ORDER — HYDROCODONE-ACETAMINOPHEN 5-325 MG PO TABS
1.0000 | ORAL_TABLET | Freq: Four times a day (QID) | ORAL | Status: DC | PRN
  Administered 2017-12-14: 1 via ORAL
  Filled 2017-12-13: qty 1

## 2017-12-13 MED ORDER — ASPIRIN EC 81 MG PO TBEC
81.0000 mg | DELAYED_RELEASE_TABLET | Freq: Every day | ORAL | Status: DC
  Administered 2017-12-14: 81 mg via ORAL
  Filled 2017-12-13: qty 1

## 2017-12-13 NOTE — Social Work (Addendum)
CSW met with spouse at bedside and provided list of SNF offers. Spouse will discuss with daughter to select a SNF at discharge.  CSW will f/u for SNF placement.  4:38pm:  CSW called daughter to discuss SNF offers and she indicated that her primary choice is [facility].  CSW then called hospital liaison for SNF and she confirmed bed offer. She indicated that she can do paperwork with family at bedside at noon tomorrow. CSW confirmed same with daughter.  PASSR pending as 30 day note on chart needs to be signed by doctor for SNF placement.  CSW will f/u tomorrow.   Elissa Hefty, LCSW Clinical Social Worker [phone]

## 2017-12-13 NOTE — NC FL2 (Signed)
[place] LEVEL OF CARE SCREENING TOOL     IDENTIFICATION  Patient Name: Cody Morris Birthdate: August 10, 1932 Sex: male Admission Date (Current Location): 12/11/2017  [facility] and [place] Number:  Herbalist and Address:  [facility] [place], [place] [address] [place], [place] [address]      Provider Number: [id number]  Attending Physician Name and Address:  Lavina Hamman, MD  Relative Name and Phone Number:  Sopheap Boehle, spouse, [phone]     Current Level of Care: Hospital Recommended Level of Care: [place] Prior Approval Number:    Date Approved/Denied:   PASRR Number: pending  Discharge Plan: SNF    Current Diagnoses: Patient Active Problem List   Diagnosis Date Noted  . Femur fracture, left ([place]) 12/11/2017  . Dementia with behavioral disturbance 10/02/2017  . Hyperlipidemia   . CAD (coronary artery disease)   . High blood pressure   . Arthritis     Orientation RESPIRATION BLADDER Height & Weight     Self  Normal External catheter Weight: 169 lb (76.7 kg) Height:  5\' 10"  (177.8 cm)  BEHAVIORAL SYMPTOMS/MOOD NEUROLOGICAL BOWEL NUTRITION STATUS  (confusion, memory)   Continent Diet(See DC Summary)  AMBULATORY STATUS COMMUNICATION OF NEEDS Skin   Extensive Assist Verbally Surgical wounds                       Personal Care Assistance Level of Assistance  Dressing, Bathing, Feeding Bathing Assistance: Maximum assistance Feeding assistance: Limited assistance Dressing Assistance: Maximum assistance     Functional Limitations Info  Sight, Hearing, Speech Sight Info: Adequate Hearing Info: Adequate Speech Info: Adequate    SPECIAL CARE FACTORS FREQUENCY  PT (By licensed PT), OT (By licensed OT)     PT Frequency: 5x week OT Frequency: 5x week            Contractures      Additional Factors Info  Code Status, Allergies, Psychotropic Code Status Info: DNR Allergies Info: No Known  Allergies Psychotropic Info: Namenda         Current Medications (12/13/2017):  This is the current hospital active medication list Current Facility-Administered Medications  Medication Dose Route Frequency Provider Last Rate Last Dose  . 0.9 %  sodium chloride infusion   Intravenous Continuous Lavina Hamman, MD 75 mL/hr at 12/11/17 1208    . acetaminophen (TYLENOL) tablet 500 mg  500 mg Oral Q6H PRN Hosie Poisson, MD   500 mg at 12/13/17 0617  . [START ON 12/14/2017] aspirin EC tablet 81 mg  81 mg Oral Daily Lavina Hamman, MD      . ceFAZolin (ANCEF) IVPB 2g/100 mL premix  2 g Intravenous Q8H Haddix, Thomasene Lot, MD 200 mL/hr at 12/13/17 0818 2 g at 12/13/17 0818  . donepezil (ARICEPT) tablet 10 mg  10 mg Oral QHS Hosie Poisson, MD   10 mg at 12/12/17 2315  . enoxaparin (LOVENOX) injection 40 mg  40 mg Subcutaneous Q24H Hosie Poisson, MD   40 mg at 12/12/17 2311  . ferrous [id number] C-folic acid (TRINSICON / FOLTRIN) capsule 1 capsule  1 capsule Oral BID Lavina Hamman, MD   1 capsule at 12/13/17 1129  . HYDROcodone-acetaminophen (NORCO/VICODIN) 5-325 MG per tablet 1 tablet  1 tablet Oral Q6H PRN Haddix, Thomasene Lot, MD      . memantine (NAMENDA) tablet 10 mg  10 mg Oral BID Hosie Poisson, MD   10 mg  at 12/13/17 0818  . methocarbamol (ROBAXIN) tablet 500 mg  500 mg Oral Q6H PRN Lavina Hamman, MD      . morphine 2 MG/ML injection 1-2 mg  1-2 mg Intravenous Q4H PRN Hosie Poisson, MD   2 mg at 12/11/17 1618  . ondansetron (ZOFRAN) injection 4 mg  4 mg Intravenous Q6H PRN Jola Schmidt, MD   4 mg at 12/11/17 0802     Discharge Medications: Please see discharge summary for a list of discharge medications.  Relevant Imaging Results:  Relevant Lab Results:   Additional Information SS#: [id number]  [name], LCSW

## 2017-12-13 NOTE — Evaluation (Signed)
Occupational Therapy Evaluation Patient Details Name: Cody Morris MRN: [id number] DOB: Feb 18, 1932 Today's Date: 12/13/2017    History of Present Illness Pt. is a 84 y.o. M with significant PMH of dementia, HTN, CAD s/p CABG, hyperlipidemia, arthritis admitted following a mechanical fall. Patient sustained a L periprosthetic femur fracture and is s/p ORIF.    Clinical Impression   This 84 y/o M presents with the above. At baseline pt receives assist from spouse for all ADL completion and spouse provides HHA for functional mobility. Pt with baseline cognitive deficits, following simple one step commands inconsistently this session, though pleasantly confused. Pt currently requires maxA+2 for bed mobility, max-totalA for ADL completion. Pt will benefit from continued acute OT services and recommend SNF level services after discharge to decrease level of caregiver burden after return home, and to maximize his safety and independence with ADLs and mobility.     Follow Up Recommendations  SNF;Supervision/Assistance - 24 hour    Equipment Recommendations  Other (comment)(TBD in next venue )           Precautions / Restrictions Precautions Precautions: Fall Restrictions Weight Bearing Restrictions: Yes LLE Weight Bearing: Weight bearing as tolerated      Mobility Bed Mobility Overal bed mobility: Needs Assistance Bed Mobility: Supine to Sit;Sit to Supine     Supine to sit: Max assist;HOB elevated Sit to supine: Max assist;+2 for physical assistance   General bed mobility comments: increased time and multimodal cues with HOB elevated to transition to sitting EOB, assist for advancing LEs and to support trunk into upright position; assist for Bil LEs and trunk support to return to supine and to boost to [facility]                         Balance Overall balance assessment: Needs assistance Sitting-balance support: Bilateral upper extremity supported;Feet supported Sitting  balance-Leahy Scale: Poor Sitting balance - Comments: pt with posterior lean upon sitting EOB requiring mod-maxA to maintain sitting balance  Postural control: Posterior lean                                 ADL either performed or assessed with clinical judgement   ADL Overall ADL's : Needs assistance/impaired Eating/Feeding: Minimal assistance;Sitting   Grooming: Minimal assistance;Bed level   Upper Body Bathing: Moderate assistance;Bed level   Lower Body Bathing: Maximal assistance;Bed level   Upper Body Dressing : Moderate assistance;Bed level;Sitting   Lower Body Dressing: Maximal assistance;+2 for physical assistance;+2 for safety/equipment;Bed level               Functional mobility during ADLs: Maximal assistance General ADL Comments: pt requiring maxA for bed mobility this session, requiring mod-maxA to maintain static sitting balance as pt with posterior lean (intermittently able to correct posture and maintain with minguard); pt follows commands inconsistently requiring increased assist for ADL/mobility completion                          Pertinent Vitals/Pain Pain Assessment: Faces Faces Pain Scale: Hurts even more Pain Location: LLE Pain Descriptors / Indicators: Guarding Pain Intervention(s): Monitored during session;Repositioned          Extremity/Trunk Assessment Upper Extremity Assessment Upper Extremity Assessment: Generalized weakness   Lower Extremity Assessment Lower Extremity Assessment: Defer to PT evaluation   Cervical / Trunk Assessment Cervical / Trunk Assessment: Kyphotic;Other exceptions  Cervical / Trunk Exceptions: Forward head posture   Communication Communication Communication: No difficulties   Cognition Arousal/Alertness: Awake/alert Behavior During Therapy: WFL for tasks assessed/performed Overall Cognitive Status: History of cognitive impairments - at baseline                                  General Comments: Patient has severe dementia. Is oriented to self only. He is very pleasant and agreeable and follows simple commands inconsistently   General Comments  Pt's spouse present during session                [place] expects to be discharged to:: Private residence Living Arrangements: Spouse/significant other Available Help at Discharge: Family Type of Home: Apartment Home Access: Level entry     Home Layout: Two level     Bathroom Shower/Tub: Teacher, early years/pre: Handicapped height Bathroom Accessibility: Yes   Home Equipment: Environmental consultant - 2 wheels          Prior Functioning/Environment Level of Independence: Needs assistance  Gait / Transfers Assistance Needed: Patient requires HHA for ambulation ADL's / Homemaking Assistance Needed: Set up needed for bathing and dressing            OT Problem List: Decreased strength;Impaired balance (sitting and/or standing);Decreased cognition;Decreased range of motion;Pain      OT Treatment/Interventions: Self-care/ADL training;DME and/or AE instruction;Balance training;Therapeutic exercise;Therapeutic activities;Energy conservation;Patient/family education    OT Goals(Current goals can be found in the care plan section) Acute Rehab OT Goals Patient Stated Goal: None stated by patient.  OT Goal Formulation: With patient/family Time For Goal Achievement: 12/27/17 Potential to Achieve Goals: Fair  OT Frequency: Min 2X/week                             AM-PAC PT "6 Clicks" Daily Activity     Outcome Measure Help from another person eating meals?: A Lot Help from another person taking care of personal grooming?: A Lot Help from another person toileting, which includes using toliet, bedpan, or urinal?: Total Help from another person bathing (including washing, rinsing, drying)?: Total Help from another person to put on and taking off regular upper body clothing?:  Total Help from another person to put on and taking off regular lower body clothing?: Total 6 Click Score: 8   End of Session Nurse Communication: Mobility status  Activity Tolerance: Patient tolerated treatment well;Other (comment)(limited due to cognitive deficits ) Patient left: in bed;with call bell/phone within reach;with family/visitor present  OT Visit Diagnosis: Other abnormalities of gait and mobility (R26.89);History of falling (Z91.81)                Time: 7824-2353 OT Time Calculation (min): 27 min Charges:  OT General Charges $OT Visit: 1 Visit OT Evaluation $OT Eval Moderate Complexity: 1 Mod OT Treatments $Self Care/Home Management : 8-22 mins G-Codes:     Lou Cal, OT Pager [phone] 12/13/2017   Raymondo Band 12/13/2017, 4:48 PM

## 2017-12-13 NOTE — Clinical Social Work Note (Signed)
Clinical Social Work Assessment  Patient Details  Name: Cody Morris MRN: [id number] Date of Birth: 01-26-1932  Date of referral:  12/13/17               Reason for consult:  Facility Placement                Permission sought to share information with:  Chartered certified accountant granted to share information::  Yes, Verbal Permission Granted  Name::     [facility] [employer]::  SNF  Relationship::  spouse & daughter  Contact Information:     Housing/Transportation Living arrangements for the past 2 months:  Single Family Home Source of Information:  Adult Children, Spouse Patient Interpreter Needed:  None Criminal Activity/Legal Involvement Pertinent to Current Situation/Hospitalization:  No - Comment as needed Significant Relationships:  Adult Children, Other Family Members, Spouse Lives with:  Spouse Do you feel safe going back to the place where you live?  No Need for family participation in patient care:  Yes (Comment)  Care giving concerns:  Pt has new impairment and will need short term rehab prior to home.  Social Worker assessment / plan:  CSW met with patient,spouse and daughter at bedside to discuss SNF recommendation. Family in agreement and indicated that patient has been to SNF before. Pt/family gave permission to send to SNF's in the local area. CSW explained the process/placement/insurance and discharge. CSW will f/u for disposition.  Employment status:  Retired Forensic scientist:  Medicare PT Recommendations:  [place] / Referral to community resources:  [place]  Patient/Family's Response to care:  Family thanked CSW for explaining SNF process and discharge. No issues or concerns.  Patient/Family's Understanding of and Emotional Response to Diagnosis, Current Treatment, and Prognosis:  Patient familly has good understanding of the impairment and agreeable to SNF as they are unable to care for  patient with new impairment. Spouse is elderly and cares for patient at home and desires him to return once he has completed rehabilitation. CSW will assist with disposition. No issues or concerns identified.  Emotional Assessment Appearance:  Appears stated age Attitude/Demeanor/Rapport:  (Cooperative) Affect (typically observed):  Calm Orientation:  Oriented to Self Alcohol / Substance use:  Not Applicable Psych involvement (Current and /or in the community):  No (Comment)  Discharge Needs  Concerns to be addressed:  Discharge Planning Concerns Readmission within the last 30 days:  No Current discharge risk:  Dependent with Mobility, Physical Impairment Barriers to Discharge:  No Barriers Identified   Cody Baxter, LCSW 12/13/2017, 12:22 PM

## 2017-12-13 NOTE — Progress Notes (Addendum)
[facility] Progress Note  Patient: Cody Morris ASN:[id number]   PCP: [name], NP DOB: [birth year]   DOA: 12/11/2017   DOS: 12/13/2017   Date of Service: the patient was seen and examined on 12/13/2017  Subjective: Feeling better, pain well controlled.  No nausea or vomiting..  Brief hospital course: Pt. with PMH of dementia, coronary artery disease S/P CABG, prior hip fracture; admitted on 12/11/2017, presented with complaint of fall, was found to have distal femur fracture. Currently further plan is await operative repair and monitor postop course.  Assessment and Plan: 1.  Mechanical fall. PT OT consulted.  Denies any head injury no focal deficit on exam. SNF recommended.  Social worker working with the case.  2.  Distal right periprosthetic femur fracture. Orthopedic consulted, underwent ORIF with Dr. headaches. Pain control per orthopedics. Weightbearing as tolerated per orthopedics.  DVT prophylaxis per orthopedics. As needed Robaxin added for muscle cramps. Social worker consulted. Family informed.  3.  Preop clearance. History of coronary artery disease without any recent cardiovascular event puts the patient at moderate risk for any abnormal cardiovascular outcome. Not prohibitive. Family understand the risk. So far tolerated the procedure very well.  Monitor.  4.  Coronary artery disease. Ejection fraction January 20 55%. No significant valvular abnormality seen at that point as well. Stress test December 12 low risk. Continue 81 mg aspirin, simvastatin.  5.  Essential hypertension. Patient on atenolol at home, blood pressure well controlled.  Currently holding.  Monitor.  6.  Dementia without behavioral disturbance.  Alzheimer's disease continue home medication. Monitor for delirium postoperatively. So far no report of agitation of acute behavioral issues, and family reports no issues at home as well  7.  Postop acute blood loss  anemia. Hemoglobin dropped from 12-8. Will monitor today. Likely there is also component of dilution. No active bleeding.  Diet: Cardiac diet DVT Prophylaxis: subcutaneous Heparin  Advance goals of care discussion: DNR/DNI per family.  Family Communication: family was present at bedside, at the time of interview. The pt provided permission to discuss medical plan with the family. Opportunity was given to ask question and all questions were answered satisfactorily.   Disposition:  Discharge to SNF facility tomorrow if hemoglobin stable.  Consultants: orthopedics Procedures: none  Antibiotics: Anti-infectives (From admission, onward)   Start     Dose/Rate Route Frequency Ordered Stop   12/13/17 0000  ceFAZolin (ANCEF) IVPB 2g/100 mL premix     2 g 200 mL/hr over 30 Minutes Intravenous Every 8 hours 12/12/17 1834 12/13/17 2359   12/12/17 1641  vancomycin (VANCOCIN) powder  Status:  Discontinued       As needed 12/12/17 1641 12/12/17 1741   12/12/17 1347  ceFAZolin (ANCEF) IVPB 2g/100 mL premix     2 g 200 mL/hr over 30 Minutes Intravenous To [facility] 12/11/17 1658 12/12/17 1604       Objective: Physical Exam: Vitals:   12/12/17 2040 12/13/17 0036 12/13/17 0500 12/13/17 1300  BP: (!) 145/70 140/80  (!) 142/68  Pulse: 93 100 89 (!) 103  Resp: 16 16 16 16   Temp: 99.1 F (37.3 C) 98.7 F (37.1 C) 98.8 F (37.1 C) (!) 97.5 F (36.4 C)  TempSrc: Oral Oral Oral Oral  SpO2: 100% 96% 92% 100%  Weight:      Height:        Intake/Output Summary (Last 24 hours) at 12/13/2017 1423 Last data filed at 12/13/2017 1300 Gross per 24 hour  Intake 1400  ml  Output 300 ml  Net 1100 ml   Filed Weights   12/11/17 1211  Weight: 76.7 kg (169 lb)   General: Alert, Awake and Oriented to Place and Person. Appear in mild distress, affect appropriate Eyes: PERRL, Conjunctiva normal ENT: Oral Mucosa clear moist. Neck: no JVD, no Abnormal Mass Or lumps Cardiovascular: S1 and S2  Present, no Murmur, Peripheral Pulses Present Respiratory: normal respiratory effort, Bilateral Air entry equal and Decreased, no use of accessory muscle, Clear to Auscultation, no Crackles, no wheezes Abdomen: Bowel Sound present, Soft and no tenderness, no hernia Skin: no redness, no Rash, no induration Extremities: no Pedal edema, no calf tenderness Neurologic: Grossly no focal neuro deficit. Bilaterally Equal motor strength  Data Reviewed: CBC: Recent Labs  Lab 12/11/17 0802 12/13/17 0652  WBC 10.8* 10.2  NEUTROABS 8.4*  --   HGB 12.1* 8.7*  HCT 37.1* 27.3*  MCV 104.2* 104.2*  PLT 179 213*   Basic Metabolic Panel: Recent Labs  Lab 12/11/17 0802 12/13/17 0652  NA 142 141  K 3.9 4.0  CL 108 107  CO2 27 24  GLUCOSE 106* 160*  BUN 21* 20  CREATININE 1.35* 1.37*  CALCIUM 9.1 8.4*    Liver Function Tests: No results for input(s): AST, ALT, ALKPHOS, BILITOT, PROT, ALBUMIN in the last 168 hours. No results for input(s): LIPASE, AMYLASE in the last 168 hours. No results for input(s): AMMONIA in the last 168 hours. Coagulation Profile: Recent Labs  Lab 12/11/17 0802  INR 1.14   Cardiac Enzymes: No results for input(s): CKTOTAL, CKMB, CKMBINDEX, TROPONINI in the last 168 hours. BNP (last 3 results) No results for input(s): PROBNP in the last 8760 hours. CBG: No results for input(s): GLUCAP in the last 168 hours. Studies: Dg Knee 1-2 Views Left  Result Date: 12/12/2017 CLINICAL DATA:  ORIF of left femoral fracture EXAM: LEFT KNEE - 1-2 VIEW; DG C-ARM 61-120 MIN COMPARISON:  Same day radiographs of the left femur FINDINGS: 1 minutes 49 seconds of fluoroscopic time was utilized during open reduction internal fixation of an acute, closed, oblique fracture of the distal diaphysis of the left femur. Subsequent images demonstrate lateral plate and screw fixation across the oblique fracture with improved anatomic alignment. Pre-existing knee arthroplasties partially included  and unremarkable. IMPRESSION: 1. ORIF of distal left femoral diaphyseal fracture with plate and screw fixation. Improved alignment is demonstrated. 2. Fluoroscopic time was utilized for a total of 1 minutes 49 seconds. Electronically Signed   By: Ashley Royalty M.D.   On: 12/12/2017 18:44   Dg C-arm 1-60 Min  Result Date: 12/12/2017 CLINICAL DATA:  ORIF of left femoral fracture EXAM: LEFT KNEE - 1-2 VIEW; DG C-ARM 61-120 MIN COMPARISON:  Same day radiographs of the left femur FINDINGS: 1 minutes 49 seconds of fluoroscopic time was utilized during open reduction internal fixation of an acute, closed, oblique fracture of the distal diaphysis of the left femur. Subsequent images demonstrate lateral plate and screw fixation across the oblique fracture with improved anatomic alignment. Pre-existing knee arthroplasties partially included and unremarkable. IMPRESSION: 1. ORIF of distal left femoral diaphyseal fracture with plate and screw fixation. Improved alignment is demonstrated. 2. Fluoroscopic time was utilized for a total of 1 minutes 49 seconds. Electronically Signed   By: Ashley Royalty M.D.   On: 12/12/2017 18:44   Dg Femur Port Min 2 Views Left  Result Date: 12/12/2017 CLINICAL DATA:  Status post ORIF, left femur fracture EXAM: LEFT FEMUR PORTABLE 2 VIEWS COMPARISON:  12/11/2017 FINDINGS: Prior left knee replacement with normal alignment. Interval surgical plate and screw fixation of the mid to distal femur across a fracture involving the distal shaft of the femur. Mild residual anterior and medial displacement of distal fracture fragment but overall decreased compared to prior study. Gas within the soft tissues of the lateral 5. Clips in the medial proximal thigh. Vascular calcifications. IMPRESSION: Interval surgical plate and screw fixation of distal femoral fracture with decreased displacement. Gas in the soft tissues consistent with recent operative status Electronically Signed   By: Donavan Foil M.D.    On: 12/12/2017 20:48    Scheduled Meds: . [START ON 12/14/2017] aspirin EC  81 mg Oral Daily  . donepezil  10 mg Oral QHS  . enoxaparin (LOVENOX) injection  40 mg Subcutaneous Q24H  . ferrous [id number] C-folic acid  1 capsule Oral BID  . memantine  10 mg Oral BID   Continuous Infusions: . sodium chloride 75 mL/hr at 12/11/17 1208  .  ceFAZolin (ANCEF) IV 2 g (12/13/17 0818)   PRN Meds: acetaminophen, HYDROcodone-acetaminophen, methocarbamol, morphine injection, ondansetron (ZOFRAN) IV  Time spent: 35 minutes  Author: Berle Mull, MD [facility] Pager: [phone] 12/13/2017 2:23 PM  If 7PM-7AM, please contact night-coverage at [email or web address], password [facility]

## 2017-12-13 NOTE — Plan of Care (Signed)
  Problem: Safety: Goal: Ability to remain free from injury will improve Outcome: Progressing   Problem: Pain Managment: Goal: General experience of comfort will improve Outcome: Progressing   

## 2017-12-13 NOTE — Social Work (Signed)
Pt will need PASSR for SNF placement. CSW placed 30 day note on chart for doctor to sign as well as DNR on chart.  CSW will continue to follow up.  Elissa Hefty, LCSW Clinical Social Worker [phone]

## 2017-12-13 NOTE — Plan of Care (Signed)

## 2017-12-13 NOTE — Evaluation (Signed)
Physical Therapy Evaluation Patient Details Name: Cody Morris MRN: [id number] DOB: August 25, 1932 Today's Date: 12/13/2017   History of Present Illness  Pt. is a 84 y.o. M with significant PMH of dementia, HTN, CAD s/p CABG, hyperlipidemia, arthritis admitted following a mechanical fall. Patient sustained a L periprosthetic femur fracture and is s/p ORIF.   Clinical Impression  Patient is s/p above surgery resulting in functional limitations due to the deficits listed below (see PT Problem List). Patient very pleasant and cooperative and is at cognitive baseline. Patient daughter and wife able to provide history. Patient is oriented to self only and responds to simple commands inconsistently. At baseline, patient ambulates with no device and HHA in household and occasionally outside. Currently, patient functional mobility is far from baseline. Requiring maxA +2 for bed mobility, minA to stand with bilateral knee buckle, and unable to attempt stand pivot today. Patient main deficit seems to be LLE pain and fear of movement rather than debility. Recommending SNF to maximize patient's functional mobility and to decrease caregiver burden. Patient daughter agreeable. Patient will benefit from skilled PT to increase their independence and safety with mobility to allow discharge to the venue listed below.       Follow Up Recommendations SNF    Equipment Recommendations  Other (comment)(Defer to SNF)    Recommendations for Other Services       Precautions / Restrictions Precautions Precautions: Fall Restrictions Weight Bearing Restrictions: Yes LLE Weight Bearing: Weight bearing as tolerated      Mobility  Bed Mobility Overal bed mobility: Needs Assistance Bed Mobility: Supine to Sit;Sit to Supine     Supine to sit: +2 for physical assistance;Mod assist Sit to supine: Max assist;+2 for physical assistance   General bed mobility comments: For supine to sit, patient requiring modA + 2 for  BLE management and scooting hips towards EOB. For sit to supine, patient requiring maxA + 2 at trunk and BLE's.   Transfers Overall transfer level: Needs assistance Equipment used: None;Rolling walker (2 wheeled) Transfers: Sit to/from Stand Sit to Stand: Min assist;+2 physical assistance         General transfer comment: Min assist for powering up from sit to stand from elevated surface with RW. VC's for hand placement. However, in standing, patient with bilateral knee buckle and assisted back onto bed. On 2nd sit to stand, patient requiring minA + 2 with no RW and bilateral knee buckle. Patient had bowel movement and was subsequently returned back to bed rather than attempting stand pivot to reclienr.   Ambulation/Gait                Stairs            Wheelchair Mobility    Modified Rankin (Stroke Patients Only)       Balance Overall balance assessment: Needs assistance Sitting-balance support: Bilateral upper extremity supported;Feet supported Sitting balance-Leahy Scale: Fair     Standing balance support: No upper extremity supported Standing balance-Leahy Scale: Poor                               Pertinent Vitals/Pain Pain Assessment: Faces Faces Pain Scale: Hurts even more Pain Location: LLE Pain Descriptors / Indicators: Guarding Pain Intervention(s): Limited activity within patient's tolerance;Monitored during session    [place] expects to be discharged to:: Private residence Living Arrangements: Spouse/significant other Available Help at Discharge: Family Type of Home: Apartment Home Access: Level  entry     Home Layout: Two level Home Equipment: Walker - 2 wheels      Prior Function Level of Independence: Needs assistance   Gait / Transfers Assistance Needed: Patient requires HHA for ambulation  ADL's / Homemaking Assistance Needed: Set up needed for bathing and dressing        Hand Dominance         Extremity/Trunk Assessment   Upper Extremity Assessment Upper Extremity Assessment: Overall WFL for tasks assessed    Lower Extremity Assessment Lower Extremity Assessment: LLE deficits/detail;RLE deficits/detail RLE Deficits / Details: Not formally assessed due to cognitive impairment. Knee buckle during stand.  LLE Deficits / Details: Not formally assessed due to cognitive status. Patient with decreased weightbearing on left side and had knee buckle during standing.    Cervical / Trunk Assessment Cervical / Trunk Assessment: Kyphotic;Other exceptions Cervical / Trunk Exceptions: Forward head posture  Communication   Communication: No difficulties  Cognition Arousal/Alertness: Awake/alert Behavior During Therapy: WFL for tasks assessed/performed Overall Cognitive Status: History of cognitive impairments - at baseline                                 General Comments: Patient has severe dementia. Is oriented to self only. He is very pleasant and agreeable and follows simple commands inconsistently      General Comments General comments (skin integrity, edema, etc.): Patient wife and daughter present throughout.     Exercises     Assessment/Plan    PT Assessment Patient needs continued PT services  PT Problem List Decreased strength;Decreased range of motion;Decreased activity tolerance;Decreased balance;Decreased mobility;Decreased coordination;Decreased cognition;Pain       PT Treatment Interventions DME instruction;Gait training;Functional mobility training;Therapeutic activities;Therapeutic exercise;Balance training;Patient/family education    PT Goals (Current goals can be found in the Care Plan section)  Acute Rehab PT Goals Patient Stated Goal: None stated by patient. Patient daughter wants SNF. PT Goal Formulation: With patient/family Time For Goal Achievement: 12/27/17 Potential to Achieve Goals: Fair    Frequency Min 3X/week   Barriers to  discharge        Co-evaluation               AM-PAC PT "6 Clicks" Daily Activity  Outcome Measure Difficulty turning over in bed (including adjusting bedclothes, sheets and blankets)?: Unable Difficulty moving from lying on back to sitting on the side of the bed? : Unable Difficulty sitting down on and standing up from a chair with arms (e.g., wheelchair, bedside commode, etc,.)?: Unable Help needed moving to and from a bed to chair (including a wheelchair)?: A Lot Help needed walking in hospital room?: A Lot Help needed climbing 3-5 steps with a railing? : Total 6 Click Score: 8    End of Session Equipment Utilized During Treatment: Gait belt Activity Tolerance: Patient tolerated treatment well Patient left: in bed;with call bell/phone within reach;with family/visitor present Nurse Communication: Mobility status;Other (comment)(NT notified for perineal hygiene. ) PT Visit Diagnosis: Difficulty in walking, not elsewhere classified (R26.2);Muscle weakness (generalized) (M62.81);Pain Pain - Right/Left: Left Pain - part of body: Leg    Time: 0930-1004 PT Time Calculation (min) (ACUTE ONLY): 34 min   Charges:   PT Evaluation $PT Eval Moderate Complexity: 1 Mod PT Treatments $Therapeutic Activity: 8-22 mins   PT G Codes:        Ellamae Sia, PT, DPT Acute Rehabilitation Services  Pager: [place]  Fabiola Backer 12/13/2017, 11:03 AM

## 2017-12-13 NOTE — Progress Notes (Signed)
Orthopaedic Trauma Progress Note  S: Resting comfortably. Wife and daughter state he is doing okay, pain controlled.  O: Gen: sleeping LLE: Dressing clean, dry and intact. Compartments soft and compressible. Did not perform neuro exam CBC    Component Value Date/Time   WBC 10.2 12/13/2017 0652   RBC 2.62 (L) 12/13/2017 0652   HGB 8.7 (L) 12/13/2017 0652   HCT 27.3 (L) 12/13/2017 0652   PLT 140 (L) 12/13/2017 0652   MCV 104.2 (H) 12/13/2017 0652   MCH 33.2 12/13/2017 0652   MCHC 31.9 12/13/2017 0652   RDW 12.7 12/13/2017 0652   LYMPHSABS 1.3 12/11/2017 0802   MONOABS 0.8 12/11/2017 0802   EOSABS 0.3 12/11/2017 0802   BASOSABS 0.0 12/11/2017 08012    A/P: 84 year old male with dementia and left periprosthetic femur fracture  WBAT LLE PT/OT Pain control Lovenox Dressing down tomorrow Follow up in 2-3 weeks  Shona Needles, MD [facility] [phone] (phone)

## 2017-12-14 DIAGNOSIS — F0390 Unspecified dementia without behavioral disturbance: Secondary | ICD-10-CM | POA: Diagnosis present

## 2017-12-14 LAB — CBC
HEMATOCRIT: 24.4 % — AB (ref 39.0–52.0)
HEMATOCRIT: 27.3 % — AB (ref 39.0–52.0)
HEMOGLOBIN: 7.9 g/dL — AB (ref 13.0–17.0)
Hemoglobin: 8.7 g/dL — ABNORMAL LOW (ref 13.0–17.0)
MCH: 33.1 pg (ref 26.0–34.0)
MCH: 32.7 pg (ref 26.0–34.0)
MCHC: 32.4 g/dL (ref 30.0–36.0)
MCHC: 31.9 g/dL (ref 30.0–36.0)
MCV: 102.1 fL — ABNORMAL HIGH (ref 78.0–100.0)
MCV: 102.6 fL — AB (ref 78.0–100.0)
PLATELETS: 164 10*3/uL (ref 150–400)
Platelets: 133 10*3/uL — ABNORMAL LOW (ref 150–400)
RBC: 2.39 MIL/uL — AB (ref 4.22–5.81)
RBC: 2.66 MIL/uL — AB (ref 4.22–5.81)
RDW: 12.8 % (ref 11.5–15.5)
RDW: 12.9 % (ref 11.5–15.5)
WBC: 10.9 10*3/uL — ABNORMAL HIGH (ref 4.0–10.5)
WBC: 11.9 10*3/uL — ABNORMAL HIGH (ref 4.0–10.5)

## 2017-12-14 LAB — BASIC METABOLIC PANEL
ANION GAP: 9 (ref 5–15)
BUN: 18 mg/dL (ref 6–20)
CO2: 22 mmol/L (ref 22–32)
Calcium: 8.1 mg/dL — ABNORMAL LOW (ref 8.9–10.3)
Chloride: 107 mmol/L (ref 101–111)
Creatinine, Ser: 1.18 mg/dL (ref 0.61–1.24)
GFR calc Af Amer: >60 mL/min (ref >60)
GFR, EST NON AFRICAN AMERICAN: 55 mL/min — AB (ref >60)
GLUCOSE: 124 mg/dL — AB (ref 65–99)
POTASSIUM: 3.3 mmol/L — AB (ref 3.5–5.1)
Sodium: 138 mmol/L (ref 135–145)

## 2017-12-14 MED ORDER — ENOXAPARIN SODIUM 40 MG/0.4ML ~~LOC~~ SOLN: 40.0000 mg | SUBCUTANEOUS | 0 refills | Status: DC

## 2017-12-14 MED ORDER — POTASSIUM CHLORIDE CRYS ER 20 MEQ PO TBCR: 40.0000 meq | EXTENDED_RELEASE_TABLET | Freq: Once | ORAL | Status: DC

## 2017-12-14 MED ORDER — POTASSIUM CHLORIDE 20 MEQ PO PACK
40.0000 meq | PACK | Freq: Once | ORAL | Status: AC
  Administered 2017-12-14: 40 meq via ORAL
  Filled 2017-12-14 (×2): qty 2

## 2017-12-14 MED ORDER — FE FUMARATE-B12-VIT C-FA-IFC PO CAPS: 1.0000 | ORAL_CAPSULE | Freq: Two times a day (BID) | ORAL | 0 refills | Status: AC

## 2017-12-14 MED ORDER — HYDROCODONE-ACETAMINOPHEN 5-325 MG PO TABS: 1.0000 | ORAL_TABLET | Freq: Four times a day (QID) | ORAL | 0 refills | Status: DC | PRN

## 2017-12-14 NOTE — Clinical Social Work Placement (Signed)
   CLINICAL SOCIAL WORK PLACEMENT  NOTE  Date:  12/14/2017  Patient Details  Name: Cody Morris MRN: [id number] Date of Birth: 11/25/1931  Clinical Social Work is seeking post-discharge placement for this patient at the [place] level of care (*CSW will initial, date and re-position this form in  chart as items are completed):  Yes   Patient/family provided with [place] Work Department's list of facilities offering this level of care within the geographic area requested by the patient (or if unable, by the patient's family).  Yes   Patient/family informed of their freedom to choose among providers that offer the needed level of care, that participate in Medicare, Medicaid or managed care program needed by the patient, have an available bed and are willing to accept the patient.  Yes   Patient/family informed of [facility]'s ownership interest in [facility] and [facility], as well as of the fact that they are under no obligation to receive care at these facilities.  PASRR submitted to EDS on       PASRR number received on 12/14/17     Existing PASRR number confirmed on       FL2 transmitted to all facilities in geographic area requested by pt/family on 12/13/17     FL2 transmitted to all facilities within larger geographic area on       Patient informed that his/her managed care company has contracts with or will negotiate with certain facilities, including the following:        Yes   Patient/family informed of bed offers received.  Patient chooses bed at [facility]     Physician recommends and patient chooses bed at      Patient to be transferred to [facility] on 12/14/17.  Patient to be transferred to facility by [facility]     Patient family notified on 12/14/17 of transfer.  Name of family member notified:  family at bedside     PHYSICIAN       Additional Comment:     _______________________________________________ [name], LCSW 12/14/2017, 2:40 PM

## 2017-12-14 NOTE — Social Work (Signed)
Clinical Social Worker facilitated patient discharge including contacting patient family and facility to confirm patient discharge plans.  Clinical information faxed to facility and family agreeable with plan.   CSW arranged ambulance transport via [facility] to [place].    RN to call [phone] to give report prior to discharge.  Clinical Social Worker will sign off for now as social work intervention is no longer needed. Please consult [place] again if new need arises.  Elissa Hefty, LCSW Clinical Social Worker [phone]

## 2017-12-14 NOTE — Social Work (Signed)
CSW obtained EXBMW#[id number] E.  CSW f/u for discharge summary.  Elissa Hefty, LCSW Clinical Social Worker [phone]

## 2017-12-14 NOTE — Discharge Instructions (Signed)
[facility] Discharge Instructions   General Discharge Instructions  WEIGHT BEARING STATUS: Weight bearing as tolerated  RANGE OF MOTION/ACTIVITY: No restrictions  Wound Care: Keep dressing in place for another 2-3 days then may remove and keep open to air if incision dry  DVT/PE prophylaxis: Lovenox 40 mg for 30 days  Diet: as you were eating previously.  Can use over the counter stool softeners and bowel preparations, such as Miralax, to help with bowel movements.  Narcotics can be constipating.  Be sure to drink plenty of fluids  PAIN MEDICATION USE AND EXPECTATIONS  You have likely been given narcotic medications to help control your pain.  After a traumatic event that results in an fracture (broken bone) with or without surgery, it is ok to use narcotic pain medications to help control one's pain.  We understand that everyone responds to pain differently and each individual patient will be evaluated on a regular basis for the continued need for narcotic medications. Ideally, narcotic medication use should last no more than 6-8 weeks (coinciding with fracture healing).   As a patient it is your responsibility as well to monitor narcotic medication use and report the amount and frequency you use these medications when you come to your office visit.   We would also advise that if you are using narcotic medications, you should take a dose prior to therapy to maximize you participation.  IF YOU ARE ON NARCOTIC MEDICATIONS IT IS NOT PERMISSIBLE TO OPERATE A MOTOR VEHICLE (MOTORCYCLE/CAR/TRUCK/MOPED) OR HEAVY MACHINERY DO NOT MIX NARCOTICS WITH OTHER CNS (CENTRAL NERVOUS SYSTEM) DEPRESSANTS SUCH AS ALCOHOL   STOP SMOKING OR USING NICOTINE PRODUCTS!!!!  As discussed nicotine severely impairs your body's ability to heal surgical and traumatic wounds but also impairs bone healing.  Wounds and bone heal by forming microscopic blood vessels (angiogenesis) and nicotine is a  vasoconstrictor (essentially, shrinks blood vessels).  Therefore, if vasoconstriction occurs to these microscopic blood vessels they essentially disappear and are unable to deliver necessary nutrients to the healing tissue.  This is one modifiable factor that you can do to dramatically increase your chances of healing your injury.    (This means no smoking, no nicotine gum, patches, etc)  DO NOT USE NONSTEROIDAL ANTI-INFLAMMATORY DRUGS (NSAID'S)  Using products such as Advil (ibuprofen), Aleve (naproxen), Motrin (ibuprofen) for additional pain control during fracture healing can delay and/or prevent the healing response.  If you would like to take over the counter (OTC) medication, Tylenol (acetaminophen) is ok.  However, some narcotic medications that are given for pain control contain acetaminophen as well. Therefore, you should not exceed more than 4000 mg of tylenol in a day if you do not have liver disease.  Also note that there are may OTC medicines, such as cold medicines and allergy medicines that my contain tylenol as well.  If you have any questions about medications and/or interactions please ask your doctor/PA or your pharmacist.      ICE AND ELEVATE INJURED/OPERATIVE EXTREMITY  Using ice and elevating the injured extremity above your heart can help with swelling and pain control.  Icing in a pulsatile fashion, such as 20 minutes on and 20 minutes off, can be followed.    Do not place ice directly on skin. Make sure there is a barrier between to skin and the ice pack.    Using frozen items such as frozen peas works well as the conform nicely to the are that needs to be iced.  USE AN  ACE WRAP OR TED HOSE FOR SWELLING CONTROL  In addition to icing and elevation, Ace wraps or TED hose are used to help limit and resolve swelling.  It is recommended to use Ace wraps or TED hose until you are informed to stop.    When using Ace Wraps start the wrapping distally (farthest away from the body) and  wrap proximally (closer to the body)   Example: If you had surgery on your leg or thing and you do not have a splint on, start the ace wrap at the toes and work your way up to the thigh        If you had surgery on your upper extremity and do not have a splint on, start the ace wrap at your fingers and work your way up to the upper arm  IF YOU ARE IN A SPLINT OR CAST DO NOT [place]   If your splint gets wet for any reason please contact the office immediately. You may shower in your splint or cast as long as you keep it dry.  This can be done by wrapping in a cast cover or garbage back (or similar)  Do Not stick any thing down your splint or cast such as pencils, money, or hangers to try and scratch yourself with.  If you feel itchy take benadryl as prescribed on the bottle for itching  IF YOU ARE IN A CAM BOOT (BLACK BOOT)  You may remove boot periodically. Perform daily dressing changes as noted below.  Wash the liner of the boot regularly and wear a sock when wearing the boot. It is recommended that you sleep in the boot until told otherwise  CALL THE OFFICE WITH ANY QUESTIONS OR CONCERNS: [phone]

## 2017-12-14 NOTE — NC FL2 (Signed)
[place] LEVEL OF CARE SCREENING TOOL     IDENTIFICATION  Patient Name: Cody Morris Birthdate: April 30, 1932 Sex: male Admission Date (Current Location): 12/11/2017  [facility] and [place] Number:  Herbalist and Address:  [facility]. [facility], [place] [address] [place], [place] [address]      Provider Number: [id number]  Attending Physician Name and Address:  Lavina Hamman, MD  Relative Name and Phone Number:  Richmond Coldren, spouse, [phone]     Current Level of Care: Hospital Recommended Level of Care: [place] Prior Approval Number:    Date Approved/Denied:   PASRR Number: pending  Discharge Plan: SNF    Current Diagnoses: Patient Active Problem List   Diagnosis Date Noted  . Dementia without behavioral disturbance 12/14/2017  . Femur fracture, left ([place]) 12/11/2017  . Hyperlipidemia   . CAD (coronary artery disease)   . High blood pressure   . Arthritis     Orientation RESPIRATION BLADDER Height & Weight     Self  Normal External catheter Weight: 169 lb (76.7 kg) Height:  5\' 10"  (177.8 cm)  BEHAVIORAL SYMPTOMS/MOOD NEUROLOGICAL BOWEL NUTRITION STATUS  (confusion, memory)   Continent Diet(See DC Summary)  AMBULATORY STATUS COMMUNICATION OF NEEDS Skin   Extensive Assist Verbally Surgical wounds                       Personal Care Assistance Level of Assistance  Dressing, Bathing, Feeding Bathing Assistance: Maximum assistance Feeding assistance: Limited assistance Dressing Assistance: Maximum assistance     Functional Limitations Info  Sight, Hearing, Speech Sight Info: Adequate Hearing Info: Adequate Speech Info: Adequate    SPECIAL CARE FACTORS FREQUENCY  PT (By licensed PT), OT (By licensed OT)     PT Frequency: 5x week OT Frequency: 5x week            Contractures      Additional Factors Info  Code Status, Allergies, Psychotropic Code Status Info: DNR Allergies Info: No Known  Allergies Psychotropic Info: Namenda         Current Medications (12/14/2017):  This is the current hospital active medication list Current Facility-Administered Medications  Medication Dose Route Frequency Provider Last Rate Last Dose  . 0.9 %  sodium chloride infusion   Intravenous Continuous Lavina Hamman, MD 50 mL/hr at 12/14/17 0300    . acetaminophen (TYLENOL) tablet 500 mg  500 mg Oral Q6H PRN Hosie Poisson, MD   500 mg at 12/13/17 0617  . aspirin EC tablet 81 mg  81 mg Oral Daily Lavina Hamman, MD   81 mg at 12/14/17 8182  . donepezil (ARICEPT) tablet 10 mg  10 mg Oral QHS Hosie Poisson, MD   10 mg at 12/13/17 2132  . enoxaparin (LOVENOX) injection 40 mg  40 mg Subcutaneous Q24H Hosie Poisson, MD   40 mg at 12/13/17 2131  . ferrous [id number] C-folic acid (TRINSICON / FOLTRIN) capsule 1 capsule  1 capsule Oral BID Lavina Hamman, MD   1 capsule at 12/14/17 0933  . HYDROcodone-acetaminophen (NORCO/VICODIN) 5-325 MG per tablet 1 tablet  1 tablet Oral Q6H PRN Haddix, Thomasene Lot, MD   1 tablet at 12/14/17 0932  . memantine (NAMENDA) tablet 10 mg  10 mg Oral BID Hosie Poisson, MD   10 mg at 12/14/17 0933  . methocarbamol (ROBAXIN) tablet 500 mg  500 mg Oral Q6H PRN Lavina Hamman, MD      .  morphine 2 MG/ML injection 1-2 mg  1-2 mg Intravenous Q4H PRN Hosie Poisson, MD   2 mg at 12/11/17 1618  . ondansetron (ZOFRAN) injection 4 mg  4 mg Intravenous Q6H PRN Jola Schmidt, MD   4 mg at 12/11/17 0802  . potassium chloride (KLOR-CON) packet 40 mEq  40 mEq Oral Once Haddix, Thomasene Lot, MD         Discharge Medications: Please see discharge summary for a list of discharge medications.  Relevant Imaging Results:  Relevant Lab Results:   Additional Information SS#: [id number]  [name], LCSW

## 2017-12-14 NOTE — Social Work (Signed)
CSW faxed documents to [facility] to obtain PASSR for SNF placement.  DNR signed and on chart.  CSW will f/u for PASSR.  SNF admission staff will complete admission paperwork with family today.  Pt will discharge to [facility].  Elissa Hefty, LCSW Clinical Social Worker [phone]

## 2017-12-14 NOTE — Progress Notes (Addendum)
0700 Bedside shift report. Pt sleeping comfortably, NAD, spouse at bedside. Fall precautions in place, [facility].   0930 Pt assessed per flow sheet, medicated per MAR. Wife and dgt asked RN if we could pre-medicate pt before working with PT. Pt denies pain, medicated per MAR. Pt chewed up pills, unable to swallow. Foam dressing intact to left leg, open sutures to air, dry, intact. Pt confused x3, knows name only. Fall precautions in place, [facility].   1130 PT working with pt in room. PT got pt up to chair, pt's eyes rolled back, pale, looked as though he may pass out. BP dropped after pt was moved to chair. RN called to room. Pt resting comfortably in chair, denies dizziness, SOB, pain. WCTM.   1400 Pt resting comfortably in recliner. NAD, no complaints. WCTM.   72 Pt bathed, assisted back to bed, pt will be discharged to SNF today.   1605 Report called to Nia at [facility].   23 RN discharged pt, reviewed medication list, new meds, follow up appointments, wound care, and discharge instructions with pt's wife and dgt. All questions answered and pt's wife and dgt able to understand without assistance. IV discontinued without difficulty, pt dressed and now awaiting transportation. Pt's wife has pt's personal belongings. WCTM.  [facility] here to transport pt to [facility] via stretcher. Wife and dgt have all personal belongings. NAD at time of discharge, VSS.

## 2017-12-14 NOTE — Social Work (Addendum)
CSW f/u with [facility] for PASSR as all requested additional clinicals have been faxed.  Pt has a SNF bed at [facility].  Elissa Hefty, LCSW Clinical Social Worker [phone]

## 2017-12-14 NOTE — Care Management Important Message (Signed)
Important Message  Patient Details  Name: Cody Morris MRN: [id number] Date of Birth: [birth year]   Medicare Important Message Given:  Yes    Orbie Pyo 12/14/2017, 2:12 PM

## 2017-12-14 NOTE — Progress Notes (Signed)
Physical Therapy Treatment Patient Details Name: Cody Morris MRN: [id number] DOB: 05/11/1932 Today's Date: 12/14/2017    History of Present Illness Pt. is a 84 y.o. M with significant PMH of dementia, HTN, CAD s/p CABG, hyperlipidemia, arthritis admitted following a mechanical fall. Patient sustained a L periprosthetic femur fracture and is s/p ORIF.     PT Comments    Patient was very pleasant and cooperative and able to follow one step commands but not consistently throughout session. Wife and daughter present. Pt required +2 assist for functional transfers and able to use Stedy for transfer OOB to recliner. Upon sitting in recliner pt had near syncopal episode and became alert again when laid back into supine. Orthostatic BP from supine to sitting however unable to obtain standing BP. See general comments below for BPs. RN notified and came to assess pt during session.  Pt comfortable in recliner end of session with no c/o dizziness. Continue to progress as tolerated with anticipated d/c to SNF for further skilled PT services.    Follow Up Recommendations  SNF     Equipment Recommendations  Other (comment)(Defer to SNF)    Recommendations for Other Services       Precautions / Restrictions Precautions Precautions: Fall Restrictions Weight Bearing Restrictions: Yes LLE Weight Bearing: Weight bearing as tolerated    Mobility  Bed Mobility Overal bed mobility: Needs Assistance Bed Mobility: Supine to Sit     Supine to sit: HOB elevated;Mod assist     General bed mobility comments: pt followed one step commands to sit EOB; pt required assist to bring L LE to EOB and to elevate trunk into sitting  Transfers Overall transfer level: Needs assistance Equipment used: Rolling walker (2 wheeled) Transfers: Sit to/from Stand Sit to Stand: +2 physical assistance;Mod assist;Max assist         General transfer comment: +2 assist to power up into standing and for balance in  standing; attempted sit to stand with RW intially however pt unable to achieve upright posture; pt maintained bilat knee flexion and began sitting prematurely EOB; use of Stedy and bed pad at hips to come up into standing and pt able to maintain for ~2 minutes  Ambulation/Gait                 Stairs            Wheelchair Mobility    Modified Rankin (Stroke Patients Only)       Balance Overall balance assessment: Needs assistance Sitting-balance support: Bilateral upper extremity supported;Feet supported Sitting balance-Leahy Scale: Poor Sitting balance - Comments: pt required mod A initially for sitting EOB however progressed to min guard assist Postural control: Posterior lean Standing balance support: Bilateral upper extremity supported Standing balance-Leahy Scale: Zero                              Cognition Arousal/Alertness: Awake/alert Behavior During Therapy: WFL for tasks assessed/performed Overall Cognitive Status: History of cognitive impairments - at baseline                                 General Comments: pt has dementia at baseline; pt is pleasant and agreeable and follows simple commands inconsistently      Exercises      General Comments General comments (skin integrity, edema, etc.): pt with near syncopal episode sitting up in recliner  after transfer with [facility]; pt laid back in chair to supine and pt came to and able to state his first/last name and his daughter's name (which is baseline); pt's BP in supine 125/71, sitting upright in chair 108/84, and sitting up in chair after ~5 mins 99/64 and HR up to 124--RN notified; pt denied dizziness; pt's wife and daughter present during session      Pertinent Vitals/Pain Pain Assessment: Faces Faces Pain Scale: Hurts little more Pain Location: L hip Pain Descriptors / Indicators: Guarding;Grimacing Pain Intervention(s): Limited activity within patient's tolerance;Monitored  during session;Repositioned    Home Living                      Prior Function            PT Goals (current goals can now be found in the care plan section) Acute Rehab PT Goals PT Goal Formulation: With patient/family Time For Goal Achievement: 12/27/17 Potential to Achieve Goals: Fair Progress towards PT goals: Progressing toward goals    Frequency    Min 3X/week      PT Plan Current plan remains appropriate    Co-evaluation              AM-PAC PT "6 Clicks" Daily Activity  Outcome Measure  Difficulty turning over in bed (including adjusting bedclothes, sheets and blankets)?: Unable Difficulty moving from lying on back to sitting on the side of the bed? : Unable Difficulty sitting down on and standing up from a chair with arms (e.g., wheelchair, bedside commode, etc,.)?: Unable Help needed moving to and from a bed to chair (including a wheelchair)?: A Lot Help needed walking in hospital room?: Total Help needed climbing 3-5 steps with a railing? : Total 6 Click Score: 7    End of Session Equipment Utilized During Treatment: Gait belt Activity Tolerance: Patient tolerated treatment well Patient left: with call bell/phone within reach;with family/visitor present;in chair;with chair alarm set Nurse Communication: Mobility status PT Visit Diagnosis: Difficulty in walking, not elsewhere classified (R26.2);Muscle weakness (generalized) (M62.81);Pain Pain - Right/Left: Left Pain - part of body: Leg     Time: 6294-7654 PT Time Calculation (min) (ACUTE ONLY): 27 min  Charges:  $Therapeutic Activity: 23-37 mins                    G Codes:       Earney Navy, PTA Pager: [phone]     Darliss Cheney 12/14/2017, 1:57 PM

## 2017-12-14 NOTE — Progress Notes (Addendum)
0900 Pre-medicated for pain prior to working with PT

## 2017-12-14 NOTE — Progress Notes (Signed)
Orthopaedic Trauma Progress Note  S: Up all night. Pain controlled. Wife at bedside  O: Gen: sleeping LLE: Incision clean, dry and intact. Compartments soft and compressible. Moves foots. Pain with knee ROM   A/P: 84 year old male with dementia and left periprosthetic femur fracture  WBAT LLE PT/OT Pain control Lovenox Follow up in 2-3 weeks  Shona Needles, MD [facility] [phone] (phone)

## 2017-12-14 NOTE — Discharge Summary (Signed)
[facility] Discharge Summary   Patient: Cody Morris NTI:[id number]   PCP: [name], NP DOB: 1932-02-05   Date of admission: 12/11/2017   Date of discharge:  12/14/2017    Discharge Diagnoses:  Active Problems:   Femur fracture, left (HCC)   Dementia without behavioral disturbance   Admitted From: home Disposition:  SNF  Recommendations for Outpatient Follow-up:  1. Please follow up with PCP in 1 week   Follow-up Information    Haddix, Thomasene Lot, MD. Schedule an appointment as soon as possible for a visit in 2 week(s).   Specialty:  Orthopedic Surgery Contact information: [address] [place] [address] [phone]        [name], NP. Schedule an appointment as soon as possible for a visit in 1 week(s).   Specialty:  Geriatric Medicine Contact information: [place]. [place] [address] [phone]          Diet recommendation: cardiac diet  Activity: The patient is advised to gradually reintroduce usual activities.  Discharge Condition: good  Code Status: DNR DNI  History of present illness: As per the H and P dictated on admission, "Cody Morris is a 84 y.o. male with medical history significant of dementia, hypertension, CAD, s/p CABG, hyperlipidemia, arthritis, mild anemia, b12 deficiency, h/o kidney stones, presents to [facility] after a mechanical fall this am. Pt got up from the bed and tripped and fell on his left side. He was nt able to put weight on the left lower extremity.lab work in ED showed creatinine of 1.35, wbc count of 10.8 and hemoglobin of 12.1. X rays of the left lower extremity shows oblique fracture at the mid to distal femur.  Pt was referred to medical service for admission ."  Hospital Course:  Summary of his active problems in the hospital is as following. 1.  Mechanical fall. PT OT consulted.  Denies any head injury no focal deficit on exam. SNF recommended.  Social worker arranged  placement.  2.  Distal right periprosthetic femur fracture. Orthopedic consulted, underwent ORIF with Dr. headaches. Pain control per orthopedics. Weightbearing as tolerated per orthopedics.  DVT prophylaxis per orthopedics.  3.  Preop clearance. History of coronary artery disease without any recent cardiovascular event puts the patient at moderate risk for any abnormal cardiovascular outcome. Not prohibitive. Family understand the risk. tolerated the procedure very well.  4.  Coronary artery disease. Ejection fraction January 20 55%. No significant valvular abnormality seen at that point as well. Stress test December 12 low risk. Continue 81 mg aspirin, simvastatin.  5.  Essential hypertension. Patient on atenolol at home, blood pressure well controlled.  Currently holding.  Monitor.  6.  Dementia without behavioral disturbance.  Alzheimer's disease continue home medication. Monitor for delirium postoperatively. So far no report of agitation of acute behavioral issues, and family reports no issues at home as well  7.  Postop acute blood loss anemia. Hemoglobin dropped from 12-8. Likely there is also component of dilution. No active bleeding. Hb stable. Iron supplement.  All other chronic medical condition were stable during the hospitalization.  Patient was seen by physical therapy, who recommended SNF, which was arranged by Education officer, museum and case Freight forwarder. On the day of the discharge the patient's vitals were stable, and no other acute medical condition were reported by patient. the patient was felt safe to be discharge at SNF with therapy.  Procedures and Results:  ORIF left supracondylar femur fracture  Consultations:  Orthopedics  DISCHARGE MEDICATION: Allergies as of 12/14/2017   No Known Allergies     Medication List    TAKE these medications   acetaminophen 500 MG tablet Commonly known as:  TYLENOL Take 500 mg by mouth every 6 (six) hours as needed.    aspirin EC 81 MG tablet Take 81 mg by mouth daily.   benzonatate 100 MG capsule Commonly known as:  TESSALON Take 1 capsule (100 mg total) by mouth 3 (three) times daily as needed for cough.   BOOST PO Take 1 Can by mouth 2 (two) times daily.   donepezil 10 MG tablet Commonly known as:  ARICEPT Take 1 tablet (10 mg total) by mouth at bedtime.   enoxaparin 40 MG/0.4ML injection Commonly known as:  LOVENOX Inject 0.4 mLs (40 mg total) into the skin daily.   ferrous [id number] C-folic acid capsule Commonly known as:  TRINSICON / FOLTRIN Take 1 capsule by mouth 2 (two) times daily.   Fish Oil 500 MG Caps Take 1 capsule by mouth daily.   HYDROcodone-acetaminophen 5-325 MG tablet Commonly known as:  NORCO/VICODIN Take 1 tablet by mouth every 6 (six) hours as needed for severe pain.   memantine 10 MG tablet Commonly known as:  NAMENDA Take 1 tablet (10 mg total) by mouth 2 (two) times daily.   simvastatin 10 MG tablet Commonly known as:  ZOCOR Take 1 tablet (10 mg total) by mouth daily.   vitamin B-12 500 MCG tablet Commonly known as:  CYANOCOBALAMIN Take 500 mcg by mouth daily.      No Known Allergies Discharge Instructions    Diet - low sodium heart healthy   Complete by:  As directed    Discharge instructions   Complete by:  As directed    It is important that you read following instructions as well as go over your medication list with RN to help you understand your care after this hospitalization.  Discharge Instructions: Please follow-up with PCP in one week  Please request your primary care physician to go over all Hospital Tests and Procedure/Radiological results at the follow up,  Please get all Hospital records sent to your PCP by signing hospital release before you go home.   Do not take more than prescribed Pain, Sleep and Anxiety Medications. You were cared for by a hospitalist during your hospital stay. If you have any questions about your  discharge medications or the care you received while you were in the hospital after you are discharged, you can call the unit and ask to speak with the hospitalist on call if the hospitalist that took care of you is not available.  Once you are discharged, your primary care physician will handle any further medical issues. Please note that NO REFILLS for any discharge medications will be authorized once you are discharged, as it is imperative that you return to your primary care physician (or establish a relationship with a primary care physician if you do not have one) for your aftercare needs so that they can reassess your need for medications and monitor your lab values. You Must read complete instructions/literature along with all the possible adverse reactions/side effects for all the Medicines you take and that have been prescribed to you. Take any new Medicines after you have completely understood and accept all the possible adverse reactions/side effects. Wear Seat belts while driving.   Increase activity slowly   Complete by:  As directed      Discharge Exam:  Filed Weights   12/11/17 1211  Weight: 76.7 kg (169 lb)   Vitals:   12/14/17 0444 12/14/17 1130  BP: 119/72 125/71  Pulse: 98   Resp: 16   Temp: 98.6 F (37 C)   SpO2: 99%    General: Appear in no distress, no Rash; Oral Mucosa moist. Cardiovascular: S1 and S2 Present, no Murmur, no JVD Respiratory: Bilateral Air entry present and Clear to Auscultation, no Crackles, no wheezes Abdomen: Bowel Sound present, Soft and no tenderness Extremities: no Pedal edema, no calf tenderness Neurology: Grossly no focal neuro deficit.  The results of significant diagnostics from this hospitalization (including imaging, microbiology, ancillary and laboratory) are listed below for reference.    Significant Diagnostic Studies: Dg Chest 1 View  Result Date: 12/11/2017 CLINICAL DATA:  Recent fall with known left femoral fracture EXAM:  CHEST  1 VIEW COMPARISON:  None. FINDINGS: Cardiac shadow is within normal limits. Postsurgical changes are seen. The second most superior sternal wire is fractured of uncertain chronicity. The lungs are clear bilaterally. No acute bony abnormality is noted. IMPRESSION: No acute abnormality noted. Electronically Signed   By: Inez Catalina M.D.   On: 12/11/2017 08:53   Dg Knee 1-2 Views Left  Result Date: 12/12/2017 CLINICAL DATA:  ORIF of left femoral fracture EXAM: LEFT KNEE - 1-2 VIEW; DG C-ARM 61-120 MIN COMPARISON:  Same day radiographs of the left femur FINDINGS: 1 minutes 49 seconds of fluoroscopic time was utilized during open reduction internal fixation of an acute, closed, oblique fracture of the distal diaphysis of the left femur. Subsequent images demonstrate lateral plate and screw fixation across the oblique fracture with improved anatomic alignment. Pre-existing knee arthroplasties partially included and unremarkable. IMPRESSION: 1. ORIF of distal left femoral diaphyseal fracture with plate and screw fixation. Improved alignment is demonstrated. 2. Fluoroscopic time was utilized for a total of 1 minutes 49 seconds. Electronically Signed   By: Ashley Royalty M.D.   On: 12/12/2017 18:44   Dg C-arm 1-60 Min  Result Date: 12/12/2017 CLINICAL DATA:  ORIF of left femoral fracture EXAM: LEFT KNEE - 1-2 VIEW; DG C-ARM 61-120 MIN COMPARISON:  Same day radiographs of the left femur FINDINGS: 1 minutes 49 seconds of fluoroscopic time was utilized during open reduction internal fixation of an acute, closed, oblique fracture of the distal diaphysis of the left femur. Subsequent images demonstrate lateral plate and screw fixation across the oblique fracture with improved anatomic alignment. Pre-existing knee arthroplasties partially included and unremarkable. IMPRESSION: 1. ORIF of distal left femoral diaphyseal fracture with plate and screw fixation. Improved alignment is demonstrated. 2. Fluoroscopic time was  utilized for a total of 1 minutes 49 seconds. Electronically Signed   By: Ashley Royalty M.D.   On: 12/12/2017 18:44   Dg Hip Unilat With Pelvis 2-3 Views Left  Result Date: 12/11/2017 CLINICAL DATA:  Fall yesterday with left hip pain, initial encounter EXAM: DG HIP (WITH OR WITHOUT PELVIS) 2-3V LEFT COMPARISON:  None. FINDINGS: Right hip prosthesis is noted. Pelvic ring is intact. No acute fracture or dislocation is noted. Degenerative changes of the left hip joint are noted. No other focal abnormality is seen. IMPRESSION: Degenerative change without acute abnormality. Electronically Signed   By: Inez Catalina M.D.   On: 12/11/2017 08:50   Dg Femur Min 2 Views Left  Result Date: 12/11/2017 CLINICAL DATA:  Fall yesterday with left leg pain, initial encounter EXAM: LEFT FEMUR 2 VIEWS COMPARISON:  None. FINDINGS: Left knee prosthesis is noted  in satisfactory position. Just above the knee prosthesis at the junction of the diaphysis and metaphysis in the distal left femur there is an oblique fracture identified. Some medial and posterior angulation of the distal fracture fragment is noted. No significant soft tissue abnormality is noted. IMPRESSION: Oblique fracture through the mid to distal left femur. Electronically Signed   By: Inez Catalina M.D.   On: 12/11/2017 08:52   Dg Femur Port Min 2 Views Left  Result Date: 12/12/2017 CLINICAL DATA:  Status post ORIF, left femur fracture EXAM: LEFT FEMUR PORTABLE 2 VIEWS COMPARISON:  12/11/2017 FINDINGS: Prior left knee replacement with normal alignment. Interval surgical plate and screw fixation of the mid to distal femur across a fracture involving the distal shaft of the femur. Mild residual anterior and medial displacement of distal fracture fragment but overall decreased compared to prior study. Gas within the soft tissues of the lateral 5. Clips in the medial proximal thigh. Vascular calcifications. IMPRESSION: Interval surgical plate and screw fixation of distal  femoral fracture with decreased displacement. Gas in the soft tissues consistent with recent operative status Electronically Signed   By: Donavan Foil M.D.   On: 12/12/2017 20:48    Microbiology: Recent Results (from the past 240 hour(s))  Surgical pcr screen     Status: None   Collection Time: 12/12/17 10:53 AM  Result Value Ref Range Status   MRSA, PCR NEGATIVE NEGATIVE Final   Staphylococcus aureus NEGATIVE NEGATIVE Final    Comment: (NOTE) The Xpert SA Assay (FDA approved for NASAL specimens in patients 28 years of age and older), is one component of a comprehensive surveillance program. It is not intended to diagnose infection nor to guide or monitor treatment. Performed at [facility], [place] [address], [place], [place] [address]      Labs: CBC: Recent Labs  Lab 12/11/17 0802 12/13/17 2542 12/14/17 0755 12/14/17 1242  WBC 10.8* 10.2 10.9* 11.9*  NEUTROABS 8.4*  --   --   --   HGB 12.1* 8.7* 7.9* 8.7*  HCT 37.1* 27.3* 24.4* 27.3*  MCV 104.2* 104.2* 102.1* 102.6*  PLT 179 140* 133* 706   Basic Metabolic Panel: Recent Labs  Lab 12/11/17 0802 12/13/17 0652 12/14/17 0603  NA 142 141 138  K 3.9 4.0 3.3*  CL 108 107 107  CO2 27 24 22   GLUCOSE 106* 160* 124*  BUN 21* 20 18  CREATININE 1.35* 1.37* 1.18  CALCIUM 9.1 8.4* 8.1*   Time spent: 35 minutes  Signed:  Berle Mull  [facility]  12/14/2017  , 2:31 PM

## 2017-12-17 ENCOUNTER — Telehealth: Payer: Self-pay

## 2017-12-17 NOTE — Telephone Encounter (Signed)
Patient's daughter, [name], called to inform the office that patient is currently admitted to [facility] for PT and rehab due to recent broken leg.

## 2017-12-20 ENCOUNTER — Encounter: Payer: Self-pay | Admitting: Student

## 2017-12-30 ENCOUNTER — Encounter (HOSPITAL_COMMUNITY): Payer: Self-pay | Admitting: Emergency Medicine

## 2017-12-30 ENCOUNTER — Observation Stay (HOSPITAL_COMMUNITY)
Admission: EM | Admit: 2017-12-30 | Discharge: 2017-12-31 | Disposition: A | Discharge status: Home / Self Care | Payer: Medicare Other | Attending: Internal Medicine | Admitting: Internal Medicine

## 2017-12-30 ENCOUNTER — Emergency Department (HOSPITAL_COMMUNITY): Payer: Medicare Other

## 2017-12-30 ENCOUNTER — Other Ambulatory Visit: Payer: Self-pay

## 2017-12-30 DIAGNOSIS — S0101XA Laceration without foreign body of scalp, initial encounter: Secondary | ICD-10-CM | POA: Insufficient documentation

## 2017-12-30 DIAGNOSIS — Y9389 Activity, other specified: Secondary | ICD-10-CM | POA: Insufficient documentation

## 2017-12-30 DIAGNOSIS — S065X9A Traumatic subdural hemorrhage with loss of consciousness of unspecified duration, initial encounter: Secondary | ICD-10-CM | POA: Diagnosis not present

## 2017-12-30 DIAGNOSIS — I259 Chronic ischemic heart disease, unspecified: Secondary | ICD-10-CM | POA: Insufficient documentation

## 2017-12-30 DIAGNOSIS — I10 Essential (primary) hypertension: Secondary | ICD-10-CM | POA: Diagnosis present

## 2017-12-30 DIAGNOSIS — I2510 Atherosclerotic heart disease of native coronary artery without angina pectoris: Secondary | ICD-10-CM | POA: Diagnosis present

## 2017-12-30 DIAGNOSIS — Z79899 Other long term (current) drug therapy: Secondary | ICD-10-CM | POA: Insufficient documentation

## 2017-12-30 DIAGNOSIS — Z7982 Long term (current) use of aspirin: Secondary | ICD-10-CM | POA: Insufficient documentation

## 2017-12-30 DIAGNOSIS — D649 Anemia, unspecified: Secondary | ICD-10-CM

## 2017-12-30 DIAGNOSIS — F0390 Unspecified dementia without behavioral disturbance: Secondary | ICD-10-CM | POA: Diagnosis not present

## 2017-12-30 DIAGNOSIS — Y999 Unspecified external cause status: Secondary | ICD-10-CM | POA: Insufficient documentation

## 2017-12-30 DIAGNOSIS — Y92122 Bedroom in nursing home as the place of occurrence of the external cause: Secondary | ICD-10-CM | POA: Insufficient documentation

## 2017-12-30 DIAGNOSIS — I609 Nontraumatic subarachnoid hemorrhage, unspecified: Secondary | ICD-10-CM

## 2017-12-30 DIAGNOSIS — W19XXXA Unspecified fall, initial encounter: Secondary | ICD-10-CM

## 2017-12-30 DIAGNOSIS — S01112A Laceration without foreign body of left eyelid and periocular area, initial encounter: Secondary | ICD-10-CM | POA: Insufficient documentation

## 2017-12-30 DIAGNOSIS — S066X9A Traumatic subarachnoid hemorrhage with loss of consciousness of unspecified duration, initial encounter: Secondary | ICD-10-CM | POA: Insufficient documentation

## 2017-12-30 DIAGNOSIS — S065XAA Traumatic subdural hemorrhage with loss of consciousness status unknown, initial encounter: Secondary | ICD-10-CM

## 2017-12-30 DIAGNOSIS — S72452A Displaced supracondylar fracture without intracondylar extension of lower end of left femur, initial encounter for closed fracture: Secondary | ICD-10-CM | POA: Diagnosis present

## 2017-12-30 DIAGNOSIS — W06XXXA Fall from bed, initial encounter: Secondary | ICD-10-CM | POA: Insufficient documentation

## 2017-12-30 DIAGNOSIS — E785 Hyperlipidemia, unspecified: Secondary | ICD-10-CM | POA: Diagnosis present

## 2017-12-30 DIAGNOSIS — Z7901 Long term (current) use of anticoagulants: Secondary | ICD-10-CM | POA: Diagnosis not present

## 2017-12-30 DIAGNOSIS — S0990XA Unspecified injury of head, initial encounter: Secondary | ICD-10-CM | POA: Diagnosis present

## 2017-12-30 LAB — CBC WITH DIFFERENTIAL/PLATELET
BASOS PCT: 0 %
Basophils Absolute: 0 10*3/uL (ref 0.0–0.1)
EOS ABS: 0.1 10*3/uL (ref 0.0–0.7)
EOS PCT: 1 %
HCT: 30.6 % — ABNORMAL LOW (ref 39.0–52.0)
Hemoglobin: 9.6 g/dL — ABNORMAL LOW (ref 13.0–17.0)
Lymphocytes Relative: 8 %
Lymphs Abs: 0.7 10*3/uL (ref 0.7–4.0)
MCH: 33.8 pg (ref 26.0–34.0)
MCHC: 31.4 g/dL (ref 30.0–36.0)
MCV: 107.7 fL — ABNORMAL HIGH (ref 78.0–100.0)
MONOS PCT: 7 %
Monocytes Absolute: 0.6 10*3/uL (ref 0.1–1.0)
NEUTROS PCT: 84 %
Neutro Abs: 7.4 10*3/uL (ref 1.7–7.7)
PLATELETS: 239 10*3/uL (ref 150–400)
RBC: 2.84 MIL/uL — ABNORMAL LOW (ref 4.22–5.81)
RDW: 14.5 % (ref 11.5–15.5)
WBC: 8.8 10*3/uL (ref 4.0–10.5)

## 2017-12-30 LAB — COMPREHENSIVE METABOLIC PANEL
ALBUMIN: 3.2 g/dL — AB (ref 3.5–5.0)
ALT: 15 U/L — ABNORMAL LOW (ref 17–63)
ANION GAP: 6 (ref 5–15)
AST: 28 U/L (ref 15–41)
Alkaline Phosphatase: 202 U/L — ABNORMAL HIGH (ref 38–126)
BUN: 22 mg/dL — ABNORMAL HIGH (ref 6–20)
CO2: 26 mmol/L (ref 22–32)
Calcium: 8.9 mg/dL (ref 8.9–10.3)
Chloride: 109 mmol/L (ref 101–111)
Creatinine, Ser: 1.23 mg/dL (ref 0.61–1.24)
GFR calc Af Amer: >60 mL/min (ref >60)
GFR calc non Af Amer: 52 mL/min — ABNORMAL LOW (ref >60)
GLUCOSE: 115 mg/dL — AB (ref 65–99)
POTASSIUM: 4 mmol/L (ref 3.5–5.1)
Sodium: 141 mmol/L (ref 135–145)
TOTAL PROTEIN: 6.1 g/dL — AB (ref 6.5–8.1)
Total Bilirubin: 0.8 mg/dL (ref 0.3–1.2)

## 2017-12-30 LAB — APTT: APTT: 25 s (ref 24–36)

## 2017-12-30 LAB — PROTIME-INR
INR: 1.2
Prothrombin Time: 15.1 seconds (ref 11.4–15.2)

## 2017-12-30 MED ORDER — ENSURE ENLIVE PO LIQD
237.0000 mL | Freq: Two times a day (BID) | ORAL | Status: DC
  Administered 2017-12-30 – 2017-12-31 (×2): 237 mL via ORAL

## 2017-12-30 MED ORDER — FE FUMARATE-B12-VIT C-FA-IFC PO CAPS
1.0000 | ORAL_CAPSULE | Freq: Two times a day (BID) | ORAL | Status: DC
  Administered 2017-12-30 – 2017-12-31 (×2): 1 via ORAL
  Filled 2017-12-30 (×4): qty 1

## 2017-12-30 MED ORDER — SODIUM CHLORIDE 0.9 % IV SOLN
10.0000 mL/h | Freq: Once | INTRAVENOUS | Status: AC
  Administered 2017-12-30: 10 mL/h via INTRAVENOUS

## 2017-12-30 MED ORDER — LIDOCAINE-EPINEPHRINE (PF) 2 %-1:200000 IJ SOLN
10.0000 mL | Freq: Once | INTRAMUSCULAR | Status: DC
  Filled 2017-12-30: qty 20

## 2017-12-30 MED ORDER — NITROGLYCERIN 0.4 MG SL SUBL: 0.4000 mg | SUBLINGUAL_TABLET | SUBLINGUAL | Status: DC | PRN

## 2017-12-30 MED ORDER — MEMANTINE HCL 10 MG PO TABS
10.0000 mg | ORAL_TABLET | Freq: Two times a day (BID) | ORAL | Status: DC
  Administered 2017-12-30 – 2017-12-31 (×3): 10 mg via ORAL
  Filled 2017-12-30 (×4): qty 1

## 2017-12-30 MED ORDER — ONDANSETRON HCL 4 MG PO TABS: 4.0000 mg | ORAL_TABLET | Freq: Four times a day (QID) | ORAL | Status: DC | PRN

## 2017-12-30 MED ORDER — ONDANSETRON HCL 4 MG/2ML IJ SOLN: 4.0000 mg | Freq: Four times a day (QID) | INTRAMUSCULAR | Status: DC | PRN

## 2017-12-30 MED ORDER — DONEPEZIL HCL 10 MG PO TABS
10.0000 mg | ORAL_TABLET | Freq: Every day | ORAL | Status: DC
  Administered 2017-12-30: 10 mg via ORAL
  Filled 2017-12-30: qty 1

## 2017-12-30 NOTE — Progress Notes (Signed)
Patient ID: Cody Morris, male   DOB: 03-18-1932, 84 y.o.   MRN: [id number] Was asked to review the CT on this 84 yo demented DNR pt who fell in nursing home, on lovenox and asa for recent femur ORIF, he is apparently at his neuro baseline, CT head shows fairly minimal findings with 2 small dots of TSAH and very small extra axial hypodensity without mass effect or shift. No acute neurosurgical intervention necessary, and since he's DNR could consider return to NH vs admit overnight for obs as neuro deterioration would not change management strategy (supportive care only).

## 2017-12-30 NOTE — ED Provider Notes (Signed)
[facility] DEPT Provider Note   CSN: [id number] Arrival date & time: 12/30/17  0520     History   Chief Complaint Chief Complaint  Patient presents with  . Fall    HPI Cody Morris is a 84 y.o. male.  HPI Cody Morris is a 84 y.o. male with history of dementia, hypertension, anemia, presents to emergency department complaining of a fall.  Patient with recent femur fracture, surgically repaired, currently at [facility] rehab facility, apparently got up on his own remove the night and fell hitting his head.  EMS reports laceration to the left eyebrow, bruising to the left arm.  Patient has history of dementia and confused at baseline.  Immobilized in cervical collar, no other treatment.  Patient is currently on Lovenox.  Past Medical History:  Diagnosis Date  . Anemia   . Arthritis   . CAD (coronary artery disease)   . Cataract   . Depression   . High blood pressure   . Hyperlipidemia   . Kidney stones 1998  . Skin cancer    face, ears  . Unspecified dementia without behavioral disturbance     Patient Active Problem List   Diagnosis Date Noted  . Dementia without behavioral disturbance 12/14/2017  . Closed displaced supracondylar fracture of distal end of left femur without intracondylar extension ([place]) 12/11/2017  . Hyperlipidemia   . CAD (coronary artery disease)   . High blood pressure   . Arthritis     Past Surgical History:  Procedure Laterality Date  . CORONARY ARTERY BYPASS GRAFT    . kidney stone removal    . open heart surgery    . ORIF FEMUR FRACTURE Left 12/12/2017   Procedure: OPEN REDUCTION INTERNAL FIXATION (ORIF) DISTAL FEMUR FRACTURE;  Surgeon: Shona Needles, MD;  Location: [facility];  Service: Orthopedics;  Laterality: Left;  . quadruple bypass     about 22 years ago  . REPLACEMENT TOTAL KNEE Left   . stints     has 3, about 20 years ago  . TOTAL HIP ARTHROPLASTY Right    2008        Home Medications     Prior to Admission medications   Medication Sig Start Date End Date Taking? Authorizing Provider  acetaminophen (TYLENOL) 500 MG tablet Take 500 mg by mouth every 6 (six) hours as needed.    [provider]  aspirin EC 81 MG tablet Take 81 mg by mouth daily.    [provider]  benzonatate (TESSALON) 100 MG capsule Take 1 capsule (100 mg total) by mouth 3 (three) times daily as needed for cough. Patient not taking: Reported on 12/11/2017 10/12/17   [name], NP  donepezil (ARICEPT) 10 MG tablet Take 1 tablet (10 mg total) by mouth at bedtime. 10/02/17   [name], NP  enoxaparin (LOVENOX) 40 MG/0.4ML injection Inject 0.4 mLs (40 mg total) into the skin daily. 12/14/17 01/13/18  HaddixThomasene Lot, MD  ferrous [id number] C-folic acid (TRINSICON / FOLTRIN) capsule Take 1 capsule by mouth 2 (two) times daily. 12/14/17   Lavina Hamman, MD  HYDROcodone-acetaminophen (NORCO/VICODIN) 5-325 MG tablet Take 1 tablet by mouth every 6 (six) hours as needed for severe pain. 12/14/17   Haddix, Thomasene Lot, MD  memantine (NAMENDA) 10 MG tablet Take 1 tablet (10 mg total) by mouth 2 (two) times daily. 10/02/17   [name], NP  Nutritional Supplements (BOOST PO) Take 1 Can by mouth  2 (two) times daily.    [provider]  Omega-3 Fatty Acids (FISH OIL) 500 MG CAPS Take 1 capsule by mouth daily.    [provider]  simvastatin (ZOCOR) 10 MG tablet Take 1 tablet (10 mg total) by mouth daily. 10/02/17   [name], NP  vitamin B-12 (CYANOCOBALAMIN) 500 MCG tablet Take 500 mcg by mouth daily.    [provider]    Family History Family History  Problem Relation Age of Onset  . Stomach cancer Mother 74  . Esophageal cancer Mother 60  . Stroke Father     Social History Social History   Tobacco Use  . Smoking status: Never Smoker  . Smokeless tobacco: Never Used  Substance Use Topics  . Alcohol use: No    Frequency: Never  . Drug  use: No     Allergies   Patient has no known allergies.   Review of Systems Review of Systems  Unable to perform ROS: Dementia     Physical Exam Updated Vital Signs BP (!) 162/83 (BP Location: Left Arm)   Pulse 75   Temp 98 F (36.7 C) (Oral)   Resp 14   Ht 6\' 2"  (1.88 m)   Wt 72.1 kg (159 lb)   SpO2 100%   BMI 20.41 kg/m   Physical Exam  Constitutional: He appears well-developed and well-nourished. No distress.  HENT:  Head: Normocephalic.  2cm laceration to left eyebrow, 2 cm laceration to the left parietal scalp.  Eyes: Pupils are equal, round, and reactive to light. Conjunctivae and EOM are normal.  Neck: Neck supple.  Cardiovascular: Normal rate, regular rhythm and normal heart sounds.  Pulmonary/Chest: Effort normal. No respiratory distress. He has no wheezes. He has no rales.  Musculoskeletal: He exhibits no edema.  Swelling to the left knee, incision to lateral knee healing well  Neurological: He is alert.  Skin: Skin is warm and dry.  Nursing note and vitals reviewed.    ED Treatments / Results  Labs (all labs ordered are listed, but only abnormal results are displayed) Labs Reviewed - No data to display  EKG None  Radiology Ct Head Wo Contrast  Result Date: 12/30/2017 CLINICAL DATA:  84 y/o M; fall from bed found prone on the floor. Two lacerations to the head. EXAM: CT HEAD WITHOUT CONTRAST CT CERVICAL SPINE WITHOUT CONTRAST TECHNIQUE: Multidetector CT imaging of the head and cervical spine was performed following the standard protocol without intravenous contrast. Multiplanar CT image reconstructions of the cervical spine were also generated. COMPARISON:  None. FINDINGS: CT HEAD FINDINGS Brain: There are thin subdural hemorrhages over the convexities bilaterally with small dense foci which may represent acute hemorrhage or retracted clot. No significant mass effect on the brain or herniation. Additionally, there are densities within anterior sylvian  fissures bilateral which may represent a very small volume of subarachnoid hemorrhage. No intraventricular hemorrhage or hydrocephalus. 14 mm extra-axial mass of the right greater sphenoid wing (series 2, image 11) likely representing meningioma. Moderate chronic microvascular ischemic changes and parenchymal volume loss of the brain. Vascular: Calcific atherosclerosis of carotid siphons. Skull: Left frontal left parietal region scalp lacerations. No calvarial fracture. Sinuses/Orbits: No acute finding. Right intra-ocular lens replacement. Other: None. CT CERVICAL SPINE FINDINGS Alignment: C3-4 and C4-5 grade 1 anterolisthesis due to facet hypertrophy. Skull base and vertebrae: No acute fracture. No primary bone lesion or focal pathologic process. Soft tissues and spinal canal: No prevertebral fluid or swelling. No visible canal hematoma.  Disc levels: Cervical spondylosis with multilevel disc and facet degenerative changes greatest at the C5-6 level. Uncovertebral and facet hypertrophy results in left-sided bony foraminal stenosis at multiple levels most pronounced at C5 through C7 and on the right at C3 through C6. Canal stenosis is moderate at the C5-6 level. Upper chest: Negative. Other: Negative. IMPRESSION: CT head: 1. Thin mixed attenuation dural hemorrhages over the convexities bilaterally. Tiny dense foci may represent acute hemorrhage or retracted clot. No significant mass effect on the brain. 2. Small densities within anterior sylvian fissures may represent a minimal volume of subarachnoid hemorrhage. 3. No brain parenchymal hemorrhage, stroke, or mass effect. 4. Background of moderate chronic microvascular ischemic changes and parenchymal volume loss of the brain. 5. Left frontal left parietal region scalp lacerations. No calvarial fracture CT cervical spine: 1. No acute fracture or dislocation. 2. Moderate cervical spondylosis greatest at C5-6 level. No high-grade bony canal stenosis. Critical  Value/emergent results were called by telephone at the time of interpretation on 12/30/2017 at 6:29 am to Dr. Veatrice Kells , who verbally acknowledged these results. Electronically Signed   By: Kristine Garbe M.D.   On: 12/30/2017 06:29   Ct Cervical Spine Wo Contrast  Result Date: 12/30/2017 CLINICAL DATA:  84 y/o M; fall from bed found prone on the floor. Two lacerations to the head. EXAM: CT HEAD WITHOUT CONTRAST CT CERVICAL SPINE WITHOUT CONTRAST TECHNIQUE: Multidetector CT imaging of the head and cervical spine was performed following the standard protocol without intravenous contrast. Multiplanar CT image reconstructions of the cervical spine were also generated. COMPARISON:  None. FINDINGS: CT HEAD FINDINGS Brain: There are thin subdural hemorrhages over the convexities bilaterally with small dense foci which may represent acute hemorrhage or retracted clot. No significant mass effect on the brain or herniation. Additionally, there are densities within anterior sylvian fissures bilateral which may represent a very small volume of subarachnoid hemorrhage. No intraventricular hemorrhage or hydrocephalus. 14 mm extra-axial mass of the right greater sphenoid wing (series 2, image 11) likely representing meningioma. Moderate chronic microvascular ischemic changes and parenchymal volume loss of the brain. Vascular: Calcific atherosclerosis of carotid siphons. Skull: Left frontal left parietal region scalp lacerations. No calvarial fracture. Sinuses/Orbits: No acute finding. Right intra-ocular lens replacement. Other: None. CT CERVICAL SPINE FINDINGS Alignment: C3-4 and C4-5 grade 1 anterolisthesis due to facet hypertrophy. Skull base and vertebrae: No acute fracture. No primary bone lesion or focal pathologic process. Soft tissues and spinal canal: No prevertebral fluid or swelling. No visible canal hematoma. Disc levels: Cervical spondylosis with multilevel disc and facet degenerative changes greatest  at the C5-6 level. Uncovertebral and facet hypertrophy results in left-sided bony foraminal stenosis at multiple levels most pronounced at C5 through C7 and on the right at C3 through C6. Canal stenosis is moderate at the C5-6 level. Upper chest: Negative. Other: Negative. IMPRESSION: CT head: 1. Thin mixed attenuation dural hemorrhages over the convexities bilaterally. Tiny dense foci may represent acute hemorrhage or retracted clot. No significant mass effect on the brain. 2. Small densities within anterior sylvian fissures may represent a minimal volume of subarachnoid hemorrhage. 3. No brain parenchymal hemorrhage, stroke, or mass effect. 4. Background of moderate chronic microvascular ischemic changes and parenchymal volume loss of the brain. 5. Left frontal left parietal region scalp lacerations. No calvarial fracture CT cervical spine: 1. No acute fracture or dislocation. 2. Moderate cervical spondylosis greatest at C5-6 level. No high-grade bony canal stenosis. Critical Value/emergent results were called by telephone at the  time of interpretation on 12/30/2017 at 6:29 am to Dr. Veatrice Kells , who verbally acknowledged these results. Electronically Signed   By: Kristine Garbe M.D.   On: 12/30/2017 06:29   Dg Femur Min 2 Views Left  Result Date: 12/30/2017 CLINICAL DATA:  Patient fell with history of recent left femoral fracture. EXAM: LEFT FEMUR 2 VIEWS COMPARISON:  12/12/2017 FINDINGS: Lateral plate and screw fixation of the left femur from the midshaft to the metaphyseal region across stone oblique fracture of the distal shaft. Fracture lines remain visible with mild callus formation suggested since the previous study. No change in alignment or position and hardware appears intact. Left knee arthroplasty with components appearing intact. Small left knee effusion is suggested. Proximal left femur is included on the left hip views. See additional report. Surgical clips in the medial soft tissues.  Vascular calcifications. IMPRESSION: Healing oblique fracture of the distal left femoral shaft post operative plate and screw fixation. No significant change in alignment since previous study. Left knee arthroplasty with components appearing well-seated. No new fractures are demonstrated. Electronically Signed   By: Lucienne Capers M.D.   On: 12/30/2017 06:52   Dg Hips Bilat W Or Wo Pelvis 3-4 Views  Result Date: 12/30/2017 CLINICAL DATA:  Patient fell out of bed and was found prone on the floor. Bilateral hip pain. Previous right hip arthroplasty. EXAM: DG HIP (WITH OR WITHOUT PELVIS) 3-4V BILAT COMPARISON:  None. FINDINGS: Postoperative right hip arthroplasty using non cemented components. Components appear well seated. No evidence of acute fracture of the right hip. Postoperative plate and screw fixation of the midshaft left femur, incompletely included within the field of view. Degenerative changes in the left hip. No acute fractures demonstrated in the left hip. Pelvis appears intact. SI joints and symphysis pubis are not displaced. IMPRESSION: No acute bony abnormalities. Postoperative right hip arthroplasty. Components appear well seated without dislocation. Postoperative internal fixation of the midshaft left femur, incompletely included within the field of view. Degenerative changes in the left hip. Electronically Signed   By: Lucienne Capers M.D.   On: 12/30/2017 06:49    Procedures .Marland KitchenLaceration Repair Date/Time: 12/30/2017 8:18 AM Performed by: Jeannett Senior, PA-C Authorized by: Jeannett Senior, PA-C   Consent:    Consent obtained:  Verbal   Consent given by:  Patient and spouse   Risks discussed:  Infection, pain and poor cosmetic result Anesthesia (see MAR for exact dosages):    Anesthesia method:  Local infiltration   Local anesthetic:  Lidocaine 2% WITH epi Laceration details:    Location:  Face   Face location:  L eyebrow   Length (cm):  3 Repair type:    Repair  type:  Simple Pre-procedure details:    Preparation:  Patient was prepped and draped in usual sterile fashion Exploration:    Hemostasis achieved with:  Direct pressure   Wound exploration: wound explored through full range of motion   Treatment:    Area cleansed with:  Saline   Amount of cleaning:  Standard   Irrigation solution:  Sterile saline   Irrigation method:  Syringe Skin repair:    Repair method:  Sutures   Suture size:  6-0   Suture material:  Fast-absorbing gut   Suture technique:  Simple interrupted   Number of sutures:  4 Approximation:    Approximation:  Close Post-procedure details:    Dressing:  Open (no dressing)   Patient tolerance of procedure:  Tolerated well, no immediate complications  .Marland KitchenLaceration  Repair Date/Time: 12/30/2017 4:38 PM Performed by: Jeannett Senior, PA-C Authorized by: Jeannett Senior, PA-C   Consent:    Consent obtained:  Verbal   Consent given by:  Spouse and guardian   Risks discussed:  Infection and pain   Alternatives discussed:  No treatment Anesthesia (see MAR for exact dosages):    Anesthesia method:  None Laceration details:    Location:  Scalp   Length (cm):  2 Repair type:    Repair type:  Simple Pre-procedure details:    Preparation:  Patient was prepped and draped in usual sterile fashion Exploration:    Hemostasis achieved with:  Direct pressure   Wound exploration: wound explored through full range of motion     Contaminated: no   Treatment:    Area cleansed with:  Saline   Amount of cleaning:  Standard   Irrigation solution:  Sterile saline   Irrigation method:  Syringe Skin repair:    Repair method:  Staples   Number of staples:  2 Approximation:    Approximation:  Close Post-procedure details:    Dressing:  Antibiotic ointment   Patient tolerance of procedure:  Tolerated well, no immediate complications   (including critical care time)  Medications Ordered in ED Medications - No data to  display   Initial Impression / Assessment and Plan / ED Course  I have reviewed the triage vital signs and the nursing notes.  Pertinent labs & imaging results that were available during my care of the patient were reviewed by me and considered in my medical decision making (see chart for details).     Pt in ED with complaint of a fall, found on the floor. Pt in NAD. Demented at baseline. No complaints. Lac to left eyebrow. Pt on aspirin and lovenox, last dose 8am yesterday morning. CTs and xrays of femur and hip ordered.   6:58 AM CT showing small subdural/subarachnoid blood. Pt is neurovascularly intact. Dr. Randal Buba spoke with Dr. Ronnald Ramp, neurosurgery. He will review CT and call back .  8:18 AM Spoke again with Dr. Ronnald Ramp who advised to admit to medicine for observation, if unchanged by next morning, okay to discharge home.  Laceration sutured.  Patient continues to be alert, confused at baseline, will speak with medicine.  Vitals:   12/30/17 0512 12/30/17 0520 12/30/17 0650  BP:  (!) 162/83 (!) 147/73  Pulse:  75 77  Resp:  14 15  Temp:  98 F (36.7 C)   TempSrc:  Oral   SpO2: 100% 100% 100%  Weight:  72.1 kg (159 lb)   Height:  6\' 2"  (1.88 m)      Final Clinical Impressions(s) / ED Diagnoses   Final diagnoses:  Fall, initial encounter  Subdural hematoma (HCC)  Subarachnoid bleed (HCC)  Laceration of left eyebrow, initial encounter    ED Discharge Orders    None       Jeannett Senior, PA-C 12/30/17 1640    Palumbo, April, MD 12/30/17 2311

## 2017-12-30 NOTE — ED Notes (Signed)
Neurosugery Paged @ [phone] for DR. Palumbo

## 2017-12-30 NOTE — ED Notes (Signed)
Pt was diverted from [facility].

## 2017-12-30 NOTE — ED Notes (Signed)
ED TO INPATIENT HANDOFF REPORT  Name/Age/Gender Cody Morris Born 84 y.o. male  Code Status    Code Status Orders  (From admission, onward)        Start     Ordered   12/30/17 0933  Do not attempt resuscitation (DNR)  Continuous    Question Answer Comment  In the event of cardiac or respiratory ARREST Do not call a "code blue"   In the event of cardiac or respiratory ARREST Do not perform Intubation, CPR, defibrillation or ACLS   In the event of cardiac or respiratory ARREST Use medication by any route, position, wound care, and other measures to relive pain and suffering. May use oxygen, suction and manual treatment of airway obstruction as needed for comfort.      12/30/17 0932    Code Status History    Date Active Date Inactive Code Status Order ID Comments User Context   12/12/2017 2157 12/14/2017 2151 DNR [id number]  Gardiner Barefoot, NP Inpatient   12/11/2017 1658 12/12/2017 2157 Full Code [id number]  Hosie Poisson, MD Inpatient      Home/SNF/Other Rehab  Chief Complaint Fall  Level of Care/Admitting Diagnosis ED Disposition    ED Disposition Condition Comment   [place]: [facility] [[id number]]  Level of Care: Med-Surg [16]  Diagnosis: Subarachnoid hemorrhage ([place]) [430.ICD-9-CM]  Admitting Physician: Janece Canterbury [id number]  Attending Physician: Janece Canterbury [[id number]]  PT Class (Do Not Modify): Observation [104]  PT Acc Code (Do Not Modify): Observation [10022]       Medical History Past Medical History:  Diagnosis Date  . Anemia   . Arthritis   . CAD (coronary artery disease)   . Cataract   . Depression   . High blood pressure   . Hyperlipidemia   . Kidney stones 1998  . Skin cancer    face, ears  . Unspecified dementia without behavioral disturbance     Allergies No Known Allergies  IV Location/Drains/Wounds Patient Lines/Drains/Airways Status   Active Line/Drains/Airways    Name:   Placement date:   Placement  time:   Site:   Days:   Peripheral IV 12/30/17 Left Antecubital   12/30/17    0648    Antecubital   less than 1   Peripheral IV 12/30/17 Right Wrist   12/30/17    0707    Wrist   less than 1   External Urinary Catheter   -    -    -      Incision (Closed) 12/12/17 Leg Left   12/12/17    1702     18          Labs/Imaging Results for orders placed or performed during the hospital encounter of 12/30/17 (from the past 48 hour(s))  CBC with Differential/Platelet     Status: Abnormal   Collection Time: 12/30/17  6:55 AM  Result Value Ref Range   WBC 8.8 4.0 - 10.5 K/uL   RBC 2.84 (L) 4.22 - 5.81 MIL/uL   Hemoglobin 9.6 (L) 13.0 - 17.0 g/dL   HCT 30.6 (L) 39.0 - 52.0 %   MCV 107.7 (H) 78.0 - 100.0 fL   MCH 33.8 26.0 - 34.0 pg   MCHC 31.4 30.0 - 36.0 g/dL   RDW 14.5 11.5 - 15.5 %   Platelets 239 150 - 400 K/uL   Neutrophils Relative % 84 %   Neutro Abs 7.4 1.7 - 7.7 K/uL   Lymphocytes Relative 8 %  Lymphs Abs 0.7 0.7 - 4.0 K/uL   Monocytes Relative 7 %   Monocytes Absolute 0.6 0.1 - 1.0 K/uL   Eosinophils Relative 1 %   Eosinophils Absolute 0.1 0.0 - 0.7 K/uL   Basophils Relative 0 %   Basophils Absolute 0.0 0.0 - 0.1 K/uL    Comment: Performed at [facility], [place] [address], [place], [place] [address]  Comprehensive metabolic panel     Status: Abnormal   Collection Time: 12/30/17  6:55 AM  Result Value Ref Range   Sodium 141 135 - 145 mmol/L   Potassium 4.0 3.5 - 5.1 mmol/L   Chloride 109 101 - 111 mmol/L   CO2 26 22 - 32 mmol/L   Glucose, Bld 115 (H) 65 - 99 mg/dL   BUN 22 (H) 6 - 20 mg/dL   Creatinine, Ser 1.23 0.61 - 1.24 mg/dL   Calcium 8.9 8.9 - 10.3 mg/dL   Total Protein 6.1 (L) 6.5 - 8.1 g/dL   Albumin 3.2 (L) 3.5 - 5.0 g/dL   AST 28 15 - 41 U/L   ALT 15 (L) 17 - 63 U/L   Alkaline Phosphatase 202 (H) 38 - 126 U/L   Total Bilirubin 0.8 0.3 - 1.2 mg/dL   GFR calc non Af Amer 52 (L) >60 mL/min   GFR calc Af Amer >60 >60 mL/min    Comment:  (NOTE) The eGFR has been calculated using the CKD EPI equation. This calculation has not been validated in all clinical situations. eGFR's persistently <60 mL/min signify possible Chronic Kidney Disease.    Anion gap 6 5 - 15    Comment: Performed at [facility], [place] [address] [place], [place] [address]  Protime-INR     Status: None   Collection Time: 12/30/17  6:55 AM  Result Value Ref Range   Prothrombin Time 15.1 11.4 - 15.2 seconds   INR 1.20     Comment: Performed at [facility], [place] [address], [place], [place] [address]  APTT     Status: None   Collection Time: 12/30/17  6:55 AM  Result Value Ref Range   aPTT 25 24 - 36 seconds    Comment: Performed at [facility], [place] [address], [place], [place] [address]  Type and screen     Status: None   Collection Time: 12/30/17  6:55 AM  Result Value Ref Range   ABO/RH(D) A POS    Antibody Screen NEG    Sample Expiration      01/02/2018 Performed at [facility], [place] [address] [place], [place] [address]   Prepare Pheresed Platelets     Status: None (Preliminary result)   Collection Time: 12/30/17  6:55 AM  Result Value Ref Range   Unit Number [id number]    Blood Component Type PLTPHER LR1    Unit division 00    Status of Unit ALLOCATED    Transfusion Status      OK TO TRANSFUSE Performed at [place] [address] [place], [place] [address]    Ct Head Wo Contrast  Result Date: 12/30/2017 CLINICAL DATA:  84 y/o M; fall from bed found prone on the floor. Two lacerations to the head. EXAM: CT HEAD WITHOUT CONTRAST CT CERVICAL SPINE WITHOUT CONTRAST TECHNIQUE: Multidetector CT imaging of the head and cervical spine was performed following the standard protocol without intravenous contrast. Multiplanar CT image reconstructions of the cervical spine were also generated.  COMPARISON:  None. FINDINGS: CT HEAD FINDINGS Brain:  There are thin subdural hemorrhages over the convexities bilaterally with small dense foci which may represent acute hemorrhage or retracted clot. No significant mass effect on the brain or herniation. Additionally, there are densities within anterior sylvian fissures bilateral which may represent a very small volume of subarachnoid hemorrhage. No intraventricular hemorrhage or hydrocephalus. 14 mm extra-axial mass of the right greater sphenoid wing (series 2, image 11) likely representing meningioma. Moderate chronic microvascular ischemic changes and parenchymal volume loss of the brain. Vascular: Calcific atherosclerosis of carotid siphons. Skull: Left frontal left parietal region scalp lacerations. No calvarial fracture. Sinuses/Orbits: No acute finding. Right intra-ocular lens replacement. Other: None. CT CERVICAL SPINE FINDINGS Alignment: C3-4 and C4-5 grade 1 anterolisthesis due to facet hypertrophy. Skull base and vertebrae: No acute fracture. No primary bone lesion or focal pathologic process. Soft tissues and spinal canal: No prevertebral fluid or swelling. No visible canal hematoma. Disc levels: Cervical spondylosis with multilevel disc and facet degenerative changes greatest at the C5-6 level. Uncovertebral and facet hypertrophy results in left-sided bony foraminal stenosis at multiple levels most pronounced at C5 through C7 and on the right at C3 through C6. Canal stenosis is moderate at the C5-6 level. Upper chest: Negative. Other: Negative. IMPRESSION: CT head: 1. Thin mixed attenuation dural hemorrhages over the convexities bilaterally. Tiny dense foci may represent acute hemorrhage or retracted clot. No significant mass effect on the brain. 2. Small densities within anterior sylvian fissures may represent a minimal volume of subarachnoid hemorrhage. 3. No brain parenchymal hemorrhage, stroke, or mass effect. 4. Background of moderate chronic microvascular ischemic changes and parenchymal volume loss  of the brain. 5. Left frontal left parietal region scalp lacerations. No calvarial fracture CT cervical spine: 1. No acute fracture or dislocation. 2. Moderate cervical spondylosis greatest at C5-6 level. No high-grade bony canal stenosis. Critical Value/emergent results were called by telephone at the time of interpretation on 12/30/2017 at 6:29 am to Dr. Veatrice Kells , who verbally acknowledged these results. Electronically Signed   By: Kristine Garbe M.D.   On: 12/30/2017 06:29   Ct Cervical Spine Wo Contrast  Result Date: 12/30/2017 CLINICAL DATA:  84 y/o M; fall from bed found prone on the floor. Two lacerations to the head. EXAM: CT HEAD WITHOUT CONTRAST CT CERVICAL SPINE WITHOUT CONTRAST TECHNIQUE: Multidetector CT imaging of the head and cervical spine was performed following the standard protocol without intravenous contrast. Multiplanar CT image reconstructions of the cervical spine were also generated. COMPARISON:  None. FINDINGS: CT HEAD FINDINGS Brain: There are thin subdural hemorrhages over the convexities bilaterally with small dense foci which may represent acute hemorrhage or retracted clot. No significant mass effect on the brain or herniation. Additionally, there are densities within anterior sylvian fissures bilateral which may represent a very small volume of subarachnoid hemorrhage. No intraventricular hemorrhage or hydrocephalus. 14 mm extra-axial mass of the right greater sphenoid wing (series 2, image 11) likely representing meningioma. Moderate chronic microvascular ischemic changes and parenchymal volume loss of the brain. Vascular: Calcific atherosclerosis of carotid siphons. Skull: Left frontal left parietal region scalp lacerations. No calvarial fracture. Sinuses/Orbits: No acute finding. Right intra-ocular lens replacement. Other: None. CT CERVICAL SPINE FINDINGS Alignment: C3-4 and C4-5 grade 1 anterolisthesis due to facet hypertrophy. Skull base and vertebrae: No acute  fracture. No primary bone lesion or focal pathologic process. Soft tissues and spinal canal: No prevertebral fluid or swelling. No visible canal hematoma. Disc levels: Cervical spondylosis  with multilevel disc and facet degenerative changes greatest at the C5-6 level. Uncovertebral and facet hypertrophy results in left-sided bony foraminal stenosis at multiple levels most pronounced at C5 through C7 and on the right at C3 through C6. Canal stenosis is moderate at the C5-6 level. Upper chest: Negative. Other: Negative. IMPRESSION: CT head: 1. Thin mixed attenuation dural hemorrhages over the convexities bilaterally. Tiny dense foci may represent acute hemorrhage or retracted clot. No significant mass effect on the brain. 2. Small densities within anterior sylvian fissures may represent a minimal volume of subarachnoid hemorrhage. 3. No brain parenchymal hemorrhage, stroke, or mass effect. 4. Background of moderate chronic microvascular ischemic changes and parenchymal volume loss of the brain. 5. Left frontal left parietal region scalp lacerations. No calvarial fracture CT cervical spine: 1. No acute fracture or dislocation. 2. Moderate cervical spondylosis greatest at C5-6 level. No high-grade bony canal stenosis. Critical Value/emergent results were called by telephone at the time of interpretation on 12/30/2017 at 6:29 am to Dr. Veatrice Kells , who verbally acknowledged these results. Electronically Signed   By: Kristine Garbe M.D.   On: 12/30/2017 06:29   Dg Femur Min 2 Views Left  Result Date: 12/30/2017 CLINICAL DATA:  Patient fell with history of recent left femoral fracture. EXAM: LEFT FEMUR 2 VIEWS COMPARISON:  12/12/2017 FINDINGS: Lateral plate and screw fixation of the left femur from the midshaft to the metaphyseal region across stone oblique fracture of the distal shaft. Fracture lines remain visible with mild callus formation suggested since the previous study. No change in alignment or  position and hardware appears intact. Left knee arthroplasty with components appearing intact. Small left knee effusion is suggested. Proximal left femur is included on the left hip views. See additional report. Surgical clips in the medial soft tissues. Vascular calcifications. IMPRESSION: Healing oblique fracture of the distal left femoral shaft post operative plate and screw fixation. No significant change in alignment since previous study. Left knee arthroplasty with components appearing well-seated. No new fractures are demonstrated. Electronically Signed   By: Lucienne Capers M.D.   On: 12/30/2017 06:52   Dg Hips Bilat W Or Wo Pelvis 3-4 Views  Result Date: 12/30/2017 CLINICAL DATA:  Patient fell out of bed and was found prone on the floor. Bilateral hip pain. Previous right hip arthroplasty. EXAM: DG HIP (WITH OR WITHOUT PELVIS) 3-4V BILAT COMPARISON:  None. FINDINGS: Postoperative right hip arthroplasty using non cemented components. Components appear well seated. No evidence of acute fracture of the right hip. Postoperative plate and screw fixation of the midshaft left femur, incompletely included within the field of view. Degenerative changes in the left hip. No acute fractures demonstrated in the left hip. Pelvis appears intact. SI joints and symphysis pubis are not displaced. IMPRESSION: No acute bony abnormalities. Postoperative right hip arthroplasty. Components appear well seated without dislocation. Postoperative internal fixation of the midshaft left femur, incompletely included within the field of view. Degenerative changes in the left hip. Electronically Signed   By: Lucienne Capers M.D.   On: 12/30/2017 06:49    Pending Labs Unresulted Labs (From admission, onward)   Start     Ordered   12/30/17 0700  ABO/Rh  Once,   R     12/30/17 0700      Vitals/Pain Today's Vitals   12/30/17 0512 12/30/17 0520 12/30/17 0650  BP:  (!) 162/83 (!) 147/73  Pulse:  75 77  Resp:  14 15  Temp:   98 F (36.7 C)  TempSrc:  Oral   SpO2: 100% 100% 100%  Weight:  159 lb (72.1 kg)   Height:  _0  (1.88 m)     Isolation Precautions No active isolations  Medications Medications  ferrous [id number] C-folic acid (TRINSICON / FOLTRIN) capsule 1 capsule (has no administration in time range)  donepezil (ARICEPT) tablet 10 mg (has no administration in time range)  memantine (NAMENDA) tablet 10 mg (has no administration in time range)  nitroGLYCERIN (NITROSTAT) SL tablet 0.4 mg (has no administration in time range)  feeding supplement (ENSURE ENLIVE) (ENSURE ENLIVE) liquid 237 mL (has no administration in time range)  ondansetron (ZOFRAN) tablet 4 mg (has no administration in time range)    Or  ondansetron (ZOFRAN) injection 4 mg (has no administration in time range)  0.9 %  sodium chloride infusion (10 mL/hr Intravenous New Bag/Given 12/30/17 0708)    Mobility walks with device

## 2017-12-30 NOTE — ED Notes (Signed)
Bed: RESB Expected date:  Expected time:  Means of arrival:  Comments: 84 yo M/ Fall

## 2017-12-30 NOTE — ED Triage Notes (Signed)
BIB [facility]. Pt suffered a fall out of his bed and was found prone on the floor. He has 2 lacerations to his head. Pt is CAOx2 per norm per staff. Pt is on blood thinners.

## 2017-12-30 NOTE — Progress Notes (Signed)
Pt. arrived to floor from ED via stretcher, spouse at bedside. Pt. alert and oriented to person, no respiratory distress noted.

## 2017-12-30 NOTE — ED Provider Notes (Signed)
[facility] DEPT Provider Note  Medical screening examination/treatment/procedure(s) were conducted as a shared visit with non-physician practitioner(s) and myself.  I personally evaluated the patient during the encounter.  None  CSN: [id number] Arrival date & time: 12/30/17  1308     History   Chief Complaint Chief Complaint  Patient presents with  . Fall    HPI Cody Morris is a 84 y.o. male.  The history is provided by the EMS personnel. The history is limited by the condition of the patient.  Fall  This is a new problem. Episode onset: unknown  The problem has not changed since onset.Pertinent negatives include no chest pain, no abdominal pain, no headaches and no shortness of breath. Nothing aggravates the symptoms. Nothing relieves the symptoms. He has tried nothing for the symptoms. The treatment provided no relief.  Fell on lovenox and aspirin at nursing home.    Past Medical History:  Diagnosis Date  . Anemia   . Arthritis   . CAD (coronary artery disease)   . Cataract   . Depression   . High blood pressure   . Hyperlipidemia   . Kidney stones 1998  . Skin cancer    face, ears  . Unspecified dementia without behavioral disturbance     Patient Active Problem List   Diagnosis Date Noted  . Dementia without behavioral disturbance 12/14/2017  . Closed displaced supracondylar fracture of distal end of left femur without intracondylar extension ([place]) 12/11/2017  . Hyperlipidemia   . CAD (coronary artery disease)   . High blood pressure   . Arthritis     Past Surgical History:  Procedure Laterality Date  . CORONARY ARTERY BYPASS GRAFT    . kidney stone removal    . open heart surgery    . ORIF FEMUR FRACTURE Left 12/12/2017   Procedure: OPEN REDUCTION INTERNAL FIXATION (ORIF) DISTAL FEMUR FRACTURE;  Surgeon: Shona Needles, MD;  Location: [facility];  Service: Orthopedics;  Laterality: Left;  . quadruple bypass     about 22 years  ago  . REPLACEMENT TOTAL KNEE Left   . stints     has 3, about 20 years ago  . TOTAL HIP ARTHROPLASTY Right    2008        Home Medications    Prior to Admission medications   Medication Sig Start Date End Date Taking? Authorizing Provider  acetaminophen (TYLENOL) 500 MG tablet Take 500 mg by mouth every 6 (six) hours as needed.    [provider]  aspirin EC 81 MG tablet Take 81 mg by mouth daily.    [provider]  benzonatate (TESSALON) 100 MG capsule Take 1 capsule (100 mg total) by mouth 3 (three) times daily as needed for cough. Patient not taking: Reported on 12/11/2017 10/12/17   [name], NP  donepezil (ARICEPT) 10 MG tablet Take 1 tablet (10 mg total) by mouth at bedtime. 10/02/17   [name], NP  enoxaparin (LOVENOX) 40 MG/0.4ML injection Inject 0.4 mLs (40 mg total) into the skin daily. 12/14/17 01/13/18  HaddixThomasene Lot, MD  ferrous [id number] C-folic acid (TRINSICON / FOLTRIN) capsule Take 1 capsule by mouth 2 (two) times daily. 12/14/17   Lavina Hamman, MD  HYDROcodone-acetaminophen (NORCO/VICODIN) 5-325 MG tablet Take 1 tablet by mouth every 6 (six) hours as needed for severe pain. 12/14/17   Haddix, Thomasene Lot, MD  memantine (NAMENDA) 10 MG tablet Take 1 tablet (10 mg total) by mouth  2 (two) times daily. 10/02/17   [name], NP  Nutritional Supplements (BOOST PO) Take 1 Can by mouth 2 (two) times daily.    [provider]  Omega-3 Fatty Acids (FISH OIL) 500 MG CAPS Take 1 capsule by mouth daily.    [provider]  simvastatin (ZOCOR) 10 MG tablet Take 1 tablet (10 mg total) by mouth daily. 10/02/17   [name], NP  vitamin B-12 (CYANOCOBALAMIN) 500 MCG tablet Take 500 mcg by mouth daily.    [provider]    Family History Family History  Problem Relation Age of Onset  . Stomach cancer Mother 59  . Esophageal cancer Mother 42  . Stroke Father     Social History Social History    Tobacco Use  . Smoking status: Never Smoker  . Smokeless tobacco: Never Used  Substance Use Topics  . Alcohol use: No    Frequency: Never  . Drug use: No     Allergies   Patient has no known allergies.   Review of Systems Review of Systems  Respiratory: Negative for shortness of breath.   Cardiovascular: Negative for chest pain.  Gastrointestinal: Negative for abdominal pain.  Skin: Positive for wound.  Neurological: Negative for headaches.  All other systems reviewed and are negative.    Physical Exam Updated Vital Signs BP (!) 147/73 (BP Location: Left Arm)   Pulse 77   Temp 98 F (36.7 C) (Oral)   Resp 15   Ht 6\' 2"  (1.88 m)   Wt 72.1 kg (159 lb)   SpO2 100%   BMI 20.41 kg/m   Physical Exam  Constitutional: He appears well-developed.  HENT:  Head: Head is without raccoon's eyes and without Battle's sign.    Right Ear: No hemotympanum.  Left Ear: No hemotympanum.  Nose: Nose normal.  Mouth/Throat: Oropharynx is clear and moist.  Eyes: Pupils are equal, round, and reactive to light. Conjunctivae are normal.  Neck: No tracheal deviation present.  Cardiovascular: Normal rate, regular rhythm, normal heart sounds and intact distal pulses.  Pulmonary/Chest: Effort normal and breath sounds normal. No stridor. He has no wheezes. He has no rales.  Abdominal: Soft. Bowel sounds are normal. He exhibits no mass. There is no tenderness. There is no rebound and no guarding.  Musculoskeletal: He exhibits no tenderness or deformity.  Neurological: He is alert. He displays normal reflexes.  Skin: Skin is warm and dry. Capillary refill takes less than 2 seconds. He is not diaphoretic.     ED Treatments / Results  Labs (all labs ordered are listed, but only abnormal results are displayed)  Results for orders placed or performed during the hospital encounter of 12/11/17  Surgical pcr screen  Result Value Ref Range   MRSA, PCR NEGATIVE NEGATIVE   Staphylococcus  aureus NEGATIVE NEGATIVE  Basic metabolic panel  Result Value Ref Range   Sodium 142 135 - 145 mmol/L   Potassium 3.9 3.5 - 5.1 mmol/L   Chloride 108 101 - 111 mmol/L   CO2 27 22 - 32 mmol/L   Glucose, Bld 106 (H) 65 - 99 mg/dL   BUN 21 (H) 6 - 20 mg/dL   Creatinine, Ser 1.35 (H) 0.61 - 1.24 mg/dL   Calcium 9.1 8.9 - 10.3 mg/dL   GFR calc non Af Amer 47 (L) >60 mL/min   GFR calc Af Amer 54 (L) >60 mL/min   Anion gap 7 5 - 15  CBC WITH DIFFERENTIAL  Result Value  Ref Range   WBC 10.8 (H) 4.0 - 10.5 K/uL   RBC 3.56 (L) 4.22 - 5.81 MIL/uL   Hemoglobin 12.1 (L) 13.0 - 17.0 g/dL   HCT 37.1 (L) 39.0 - 52.0 %   MCV 104.2 (H) 78.0 - 100.0 fL   MCH 34.0 26.0 - 34.0 pg   MCHC 32.6 30.0 - 36.0 g/dL   RDW 12.6 11.5 - 15.5 %   Platelets 179 150 - 400 K/uL   Neutrophils Relative % 78 %   Neutro Abs 8.4 (H) 1.7 - 7.7 K/uL   Lymphocytes Relative 12 %   Lymphs Abs 1.3 0.7 - 4.0 K/uL   Monocytes Relative 7 %   Monocytes Absolute 0.8 0.1 - 1.0 K/uL   Eosinophils Relative 3 %   Eosinophils Absolute 0.3 0.0 - 0.7 K/uL   Basophils Relative 0 %   Basophils Absolute 0.0 0.0 - 0.1 K/uL  Protime-INR  Result Value Ref Range   Prothrombin Time 14.5 11.4 - 15.2 seconds   INR 1.14   CBC  Result Value Ref Range   WBC 10.2 4.0 - 10.5 K/uL   RBC 2.62 (L) 4.22 - 5.81 MIL/uL   Hemoglobin 8.7 (L) 13.0 - 17.0 g/dL   HCT 27.3 (L) 39.0 - 52.0 %   MCV 104.2 (H) 78.0 - 100.0 fL   MCH 33.2 26.0 - 34.0 pg   MCHC 31.9 30.0 - 36.0 g/dL   RDW 12.7 11.5 - 15.5 %   Platelets 140 (L) 150 - 400 K/uL  Basic metabolic panel  Result Value Ref Range   Sodium 141 135 - 145 mmol/L   Potassium 4.0 3.5 - 5.1 mmol/L   Chloride 107 101 - 111 mmol/L   CO2 24 22 - 32 mmol/L   Glucose, Bld 160 (H) 65 - 99 mg/dL   BUN 20 6 - 20 mg/dL   Creatinine, Ser 1.37 (H) 0.61 - 1.24 mg/dL   Calcium 8.4 (L) 8.9 - 10.3 mg/dL   GFR calc non Af Amer 46 (L) >60 mL/min   GFR calc Af Amer 53 (L) >60 mL/min   Anion gap 10 5 - 15  Basic  metabolic panel  Result Value Ref Range   Sodium 138 135 - 145 mmol/L   Potassium 3.3 (L) 3.5 - 5.1 mmol/L   Chloride 107 101 - 111 mmol/L   CO2 22 22 - 32 mmol/L   Glucose, Bld 124 (H) 65 - 99 mg/dL   BUN 18 6 - 20 mg/dL   Creatinine, Ser 1.18 0.61 - 1.24 mg/dL   Calcium 8.1 (L) 8.9 - 10.3 mg/dL   GFR calc non Af Amer 55 (L) >60 mL/min   GFR calc Af Amer >60 >60 mL/min   Anion gap 9 5 - 15  CBC  Result Value Ref Range   WBC 10.9 (H) 4.0 - 10.5 K/uL   RBC 2.39 (L) 4.22 - 5.81 MIL/uL   Hemoglobin 7.9 (L) 13.0 - 17.0 g/dL   HCT 24.4 (L) 39.0 - 52.0 %   MCV 102.1 (H) 78.0 - 100.0 fL   MCH 33.1 26.0 - 34.0 pg   MCHC 32.4 30.0 - 36.0 g/dL   RDW 12.8 11.5 - 15.5 %   Platelets 133 (L) 150 - 400 K/uL  CBC  Result Value Ref Range   WBC 11.9 (H) 4.0 - 10.5 K/uL   RBC 2.66 (L) 4.22 - 5.81 MIL/uL   Hemoglobin 8.7 (L) 13.0 - 17.0 g/dL   HCT 27.3 (L) 39.0 -  52.0 %   MCV 102.6 (H) 78.0 - 100.0 fL   MCH 32.7 26.0 - 34.0 pg   MCHC 31.9 30.0 - 36.0 g/dL   RDW 12.9 11.5 - 15.5 %   Platelets 164 150 - 400 K/uL  Type and screen [place]  Result Value Ref Range   ABO/RH(D) A POS    Antibody Screen NEG    Sample Expiration      12/14/2017 Performed at [facility], [place] [address], [place], [place] [address]   ABO/Rh  Result Value Ref Range   ABO/RH(D)      A POS Performed at [facility], [place] [address] [place], [place] [address]   Type and screen [place]  Result Value Ref Range   ABO/RH(D) A POS    Antibody Screen NEG    Sample Expiration      12/14/2017 Performed at [facility], [place] [address], [place], [place] [address]   ABO/Rh  Result Value Ref Range   ABO/RH(D)      A POS Performed at [facility] [address] [place], [place] [address]    Dg Chest 1 View  Result Date: 12/11/2017 CLINICAL DATA:  Recent fall with known left femoral fracture EXAM: CHEST  1 VIEW COMPARISON:   None. FINDINGS: Cardiac shadow is within normal limits. Postsurgical changes are seen. The second most superior sternal wire is fractured of uncertain chronicity. The lungs are clear bilaterally. No acute bony abnormality is noted. IMPRESSION: No acute abnormality noted. Electronically Signed   By: Inez Catalina M.D.   On: 12/11/2017 08:53   Dg Knee 1-2 Views Left  Result Date: 12/12/2017 CLINICAL DATA:  ORIF of left femoral fracture EXAM: LEFT KNEE - 1-2 VIEW; DG C-ARM 61-120 MIN COMPARISON:  Same day radiographs of the left femur FINDINGS: 1 minutes 49 seconds of fluoroscopic time was utilized during open reduction internal fixation of an acute, closed, oblique fracture of the distal diaphysis of the left femur. Subsequent images demonstrate lateral plate and screw fixation across the oblique fracture with improved anatomic alignment. Pre-existing knee arthroplasties partially included and unremarkable. IMPRESSION: 1. ORIF of distal left femoral diaphyseal fracture with plate and screw fixation. Improved alignment is demonstrated. 2. Fluoroscopic time was utilized for a total of 1 minutes 49 seconds. Electronically Signed   By: Ashley Royalty M.D.   On: 12/12/2017 18:44   Ct Head Wo Contrast  Result Date: 12/30/2017 CLINICAL DATA:  84 y/o M; fall from bed found prone on the floor. Two lacerations to the head. EXAM: CT HEAD WITHOUT CONTRAST CT CERVICAL SPINE WITHOUT CONTRAST TECHNIQUE: Multidetector CT imaging of the head and cervical spine was performed following the standard protocol without intravenous contrast. Multiplanar CT image reconstructions of the cervical spine were also generated. COMPARISON:  None. FINDINGS: CT HEAD FINDINGS Brain: There are thin subdural hemorrhages over the convexities bilaterally with small dense foci which may represent acute hemorrhage or retracted clot. No significant mass effect on the brain or herniation. Additionally, there are densities within anterior sylvian fissures  bilateral which may represent a very small volume of subarachnoid hemorrhage. No intraventricular hemorrhage or hydrocephalus. 14 mm extra-axial mass of the right greater sphenoid wing (series 2, image 11) likely representing meningioma. Moderate chronic microvascular ischemic changes and parenchymal volume loss of the brain. Vascular: Calcific atherosclerosis of carotid siphons. Skull: Left frontal left parietal region scalp lacerations. No calvarial fracture. Sinuses/Orbits: No acute  finding. Right intra-ocular lens replacement. Other: None. CT CERVICAL SPINE FINDINGS Alignment: C3-4 and C4-5 grade 1 anterolisthesis due to facet hypertrophy. Skull base and vertebrae: No acute fracture. No primary bone lesion or focal pathologic process. Soft tissues and spinal canal: No prevertebral fluid or swelling. No visible canal hematoma. Disc levels: Cervical spondylosis with multilevel disc and facet degenerative changes greatest at the C5-6 level. Uncovertebral and facet hypertrophy results in left-sided bony foraminal stenosis at multiple levels most pronounced at C5 through C7 and on the right at C3 through C6. Canal stenosis is moderate at the C5-6 level. Upper chest: Negative. Other: Negative. IMPRESSION: CT head: 1. Thin mixed attenuation dural hemorrhages over the convexities bilaterally. Tiny dense foci may represent acute hemorrhage or retracted clot. No significant mass effect on the brain. 2. Small densities within anterior sylvian fissures may represent a minimal volume of subarachnoid hemorrhage. 3. No brain parenchymal hemorrhage, stroke, or mass effect. 4. Background of moderate chronic microvascular ischemic changes and parenchymal volume loss of the brain. 5. Left frontal left parietal region scalp lacerations. No calvarial fracture CT cervical spine: 1. No acute fracture or dislocation. 2. Moderate cervical spondylosis greatest at C5-6 level. No high-grade bony canal stenosis. Critical Value/emergent  results were called by telephone at the time of interpretation on 12/30/2017 at 6:29 am to Dr. Veatrice Kells , who verbally acknowledged these results. Electronically Signed   By: Kristine Garbe M.D.   On: 12/30/2017 06:29   Ct Cervical Spine Wo Contrast  Result Date: 12/30/2017 CLINICAL DATA:  84 y/o M; fall from bed found prone on the floor. Two lacerations to the head. EXAM: CT HEAD WITHOUT CONTRAST CT CERVICAL SPINE WITHOUT CONTRAST TECHNIQUE: Multidetector CT imaging of the head and cervical spine was performed following the standard protocol without intravenous contrast. Multiplanar CT image reconstructions of the cervical spine were also generated. COMPARISON:  None. FINDINGS: CT HEAD FINDINGS Brain: There are thin subdural hemorrhages over the convexities bilaterally with small dense foci which may represent acute hemorrhage or retracted clot. No significant mass effect on the brain or herniation. Additionally, there are densities within anterior sylvian fissures bilateral which may represent a very small volume of subarachnoid hemorrhage. No intraventricular hemorrhage or hydrocephalus. 14 mm extra-axial mass of the right greater sphenoid wing (series 2, image 11) likely representing meningioma. Moderate chronic microvascular ischemic changes and parenchymal volume loss of the brain. Vascular: Calcific atherosclerosis of carotid siphons. Skull: Left frontal left parietal region scalp lacerations. No calvarial fracture. Sinuses/Orbits: No acute finding. Right intra-ocular lens replacement. Other: None. CT CERVICAL SPINE FINDINGS Alignment: C3-4 and C4-5 grade 1 anterolisthesis due to facet hypertrophy. Skull base and vertebrae: No acute fracture. No primary bone lesion or focal pathologic process. Soft tissues and spinal canal: No prevertebral fluid or swelling. No visible canal hematoma. Disc levels: Cervical spondylosis with multilevel disc and facet degenerative changes greatest at the C5-6  level. Uncovertebral and facet hypertrophy results in left-sided bony foraminal stenosis at multiple levels most pronounced at C5 through C7 and on the right at C3 through C6. Canal stenosis is moderate at the C5-6 level. Upper chest: Negative. Other: Negative. IMPRESSION: CT head: 1. Thin mixed attenuation dural hemorrhages over the convexities bilaterally. Tiny dense foci may represent acute hemorrhage or retracted clot. No significant mass effect on the brain. 2. Small densities within anterior sylvian fissures may represent a minimal volume of subarachnoid hemorrhage. 3. No brain parenchymal hemorrhage, stroke, or mass effect. 4. Background of moderate chronic microvascular  ischemic changes and parenchymal volume loss of the brain. 5. Left frontal left parietal region scalp lacerations. No calvarial fracture CT cervical spine: 1. No acute fracture or dislocation. 2. Moderate cervical spondylosis greatest at C5-6 level. No high-grade bony canal stenosis. Critical Value/emergent results were called by telephone at the time of interpretation on 12/30/2017 at 6:29 am to Dr. Veatrice Kells , who verbally acknowledged these results. Electronically Signed   By: Kristine Garbe M.D.   On: 12/30/2017 06:29   Dg C-arm 1-60 Min  Result Date: 12/12/2017 CLINICAL DATA:  ORIF of left femoral fracture EXAM: LEFT KNEE - 1-2 VIEW; DG C-ARM 61-120 MIN COMPARISON:  Same day radiographs of the left femur FINDINGS: 1 minutes 49 seconds of fluoroscopic time was utilized during open reduction internal fixation of an acute, closed, oblique fracture of the distal diaphysis of the left femur. Subsequent images demonstrate lateral plate and screw fixation across the oblique fracture with improved anatomic alignment. Pre-existing knee arthroplasties partially included and unremarkable. IMPRESSION: 1. ORIF of distal left femoral diaphyseal fracture with plate and screw fixation. Improved alignment is demonstrated. 2. Fluoroscopic  time was utilized for a total of 1 minutes 49 seconds. Electronically Signed   By: Ashley Royalty M.D.   On: 12/12/2017 18:44   Dg Hip Unilat With Pelvis 2-3 Views Left  Result Date: 12/11/2017 CLINICAL DATA:  Fall yesterday with left hip pain, initial encounter EXAM: DG HIP (WITH OR WITHOUT PELVIS) 2-3V LEFT COMPARISON:  None. FINDINGS: Right hip prosthesis is noted. Pelvic ring is intact. No acute fracture or dislocation is noted. Degenerative changes of the left hip joint are noted. No other focal abnormality is seen. IMPRESSION: Degenerative change without acute abnormality. Electronically Signed   By: Inez Catalina M.D.   On: 12/11/2017 08:50   Dg Femur Min 2 Views Left  Result Date: 12/30/2017 CLINICAL DATA:  Patient fell with history of recent left femoral fracture. EXAM: LEFT FEMUR 2 VIEWS COMPARISON:  12/12/2017 FINDINGS: Lateral plate and screw fixation of the left femur from the midshaft to the metaphyseal region across stone oblique fracture of the distal shaft. Fracture lines remain visible with mild callus formation suggested since the previous study. No change in alignment or position and hardware appears intact. Left knee arthroplasty with components appearing intact. Small left knee effusion is suggested. Proximal left femur is included on the left hip views. See additional report. Surgical clips in the medial soft tissues. Vascular calcifications. IMPRESSION: Healing oblique fracture of the distal left femoral shaft post operative plate and screw fixation. No significant change in alignment since previous study. Left knee arthroplasty with components appearing well-seated. No new fractures are demonstrated. Electronically Signed   By: Lucienne Capers M.D.   On: 12/30/2017 06:52   Dg Femur Min 2 Views Left  Result Date: 12/11/2017 CLINICAL DATA:  Fall yesterday with left leg pain, initial encounter EXAM: LEFT FEMUR 2 VIEWS COMPARISON:  None. FINDINGS: Left knee prosthesis is noted in  satisfactory position. Just above the knee prosthesis at the junction of the diaphysis and metaphysis in the distal left femur there is an oblique fracture identified. Some medial and posterior angulation of the distal fracture fragment is noted. No significant soft tissue abnormality is noted. IMPRESSION: Oblique fracture through the mid to distal left femur. Electronically Signed   By: Inez Catalina M.D.   On: 12/11/2017 08:52   Dg Femur Port Min 2 Views Left  Result Date: 12/12/2017 CLINICAL DATA:  Status post ORIF, left  femur fracture EXAM: LEFT FEMUR PORTABLE 2 VIEWS COMPARISON:  12/11/2017 FINDINGS: Prior left knee replacement with normal alignment. Interval surgical plate and screw fixation of the mid to distal femur across a fracture involving the distal shaft of the femur. Mild residual anterior and medial displacement of distal fracture fragment but overall decreased compared to prior study. Gas within the soft tissues of the lateral 5. Clips in the medial proximal thigh. Vascular calcifications. IMPRESSION: Interval surgical plate and screw fixation of distal femoral fracture with decreased displacement. Gas in the soft tissues consistent with recent operative status Electronically Signed   By: Donavan Foil M.D.   On: 12/12/2017 20:48   Dg Hips Bilat W Or Wo Pelvis 3-4 Views  Result Date: 12/30/2017 CLINICAL DATA:  Patient fell out of bed and was found prone on the floor. Bilateral hip pain. Previous right hip arthroplasty. EXAM: DG HIP (WITH OR WITHOUT PELVIS) 3-4V BILAT COMPARISON:  None. FINDINGS: Postoperative right hip arthroplasty using non cemented components. Components appear well seated. No evidence of acute fracture of the right hip. Postoperative plate and screw fixation of the midshaft left femur, incompletely included within the field of view. Degenerative changes in the left hip. No acute fractures demonstrated in the left hip. Pelvis appears intact. SI joints and symphysis pubis are  not displaced. IMPRESSION: No acute bony abnormalities. Postoperative right hip arthroplasty. Components appear well seated without dislocation. Postoperative internal fixation of the midshaft left femur, incompletely included within the field of view. Degenerative changes in the left hip. Electronically Signed   By: Lucienne Capers M.D.   On: 12/30/2017 06:49    EKG None  Radiology Ct Head Wo Contrast  Result Date: 12/30/2017 CLINICAL DATA:  84 y/o M; fall from bed found prone on the floor. Two lacerations to the head. EXAM: CT HEAD WITHOUT CONTRAST CT CERVICAL SPINE WITHOUT CONTRAST TECHNIQUE: Multidetector CT imaging of the head and cervical spine was performed following the standard protocol without intravenous contrast. Multiplanar CT image reconstructions of the cervical spine were also generated. COMPARISON:  None. FINDINGS: CT HEAD FINDINGS Brain: There are thin subdural hemorrhages over the convexities bilaterally with small dense foci which may represent acute hemorrhage or retracted clot. No significant mass effect on the brain or herniation. Additionally, there are densities within anterior sylvian fissures bilateral which may represent a very small volume of subarachnoid hemorrhage. No intraventricular hemorrhage or hydrocephalus. 14 mm extra-axial mass of the right greater sphenoid wing (series 2, image 11) likely representing meningioma. Moderate chronic microvascular ischemic changes and parenchymal volume loss of the brain. Vascular: Calcific atherosclerosis of carotid siphons. Skull: Left frontal left parietal region scalp lacerations. No calvarial fracture. Sinuses/Orbits: No acute finding. Right intra-ocular lens replacement. Other: None. CT CERVICAL SPINE FINDINGS Alignment: C3-4 and C4-5 grade 1 anterolisthesis due to facet hypertrophy. Skull base and vertebrae: No acute fracture. No primary bone lesion or focal pathologic process. Soft tissues and spinal canal: No prevertebral fluid  or swelling. No visible canal hematoma. Disc levels: Cervical spondylosis with multilevel disc and facet degenerative changes greatest at the C5-6 level. Uncovertebral and facet hypertrophy results in left-sided bony foraminal stenosis at multiple levels most pronounced at C5 through C7 and on the right at C3 through C6. Canal stenosis is moderate at the C5-6 level. Upper chest: Negative. Other: Negative. IMPRESSION: CT head: 1. Thin mixed attenuation dural hemorrhages over the convexities bilaterally. Tiny dense foci may represent acute hemorrhage or retracted clot. No significant mass effect on the brain.  2. Small densities within anterior sylvian fissures may represent a minimal volume of subarachnoid hemorrhage. 3. No brain parenchymal hemorrhage, stroke, or mass effect. 4. Background of moderate chronic microvascular ischemic changes and parenchymal volume loss of the brain. 5. Left frontal left parietal region scalp lacerations. No calvarial fracture CT cervical spine: 1. No acute fracture or dislocation. 2. Moderate cervical spondylosis greatest at C5-6 level. No high-grade bony canal stenosis. Critical Value/emergent results were called by telephone at the time of interpretation on 12/30/2017 at 6:29 am to Dr. Veatrice Kells , who verbally acknowledged these results. Electronically Signed   By: Kristine Garbe M.D.   On: 12/30/2017 06:29   Ct Cervical Spine Wo Contrast  Result Date: 12/30/2017 CLINICAL DATA:  84 y/o M; fall from bed found prone on the floor. Two lacerations to the head. EXAM: CT HEAD WITHOUT CONTRAST CT CERVICAL SPINE WITHOUT CONTRAST TECHNIQUE: Multidetector CT imaging of the head and cervical spine was performed following the standard protocol without intravenous contrast. Multiplanar CT image reconstructions of the cervical spine were also generated. COMPARISON:  None. FINDINGS: CT HEAD FINDINGS Brain: There are thin subdural hemorrhages over the convexities bilaterally with  small dense foci which may represent acute hemorrhage or retracted clot. No significant mass effect on the brain or herniation. Additionally, there are densities within anterior sylvian fissures bilateral which may represent a very small volume of subarachnoid hemorrhage. No intraventricular hemorrhage or hydrocephalus. 14 mm extra-axial mass of the right greater sphenoid wing (series 2, image 11) likely representing meningioma. Moderate chronic microvascular ischemic changes and parenchymal volume loss of the brain. Vascular: Calcific atherosclerosis of carotid siphons. Skull: Left frontal left parietal region scalp lacerations. No calvarial fracture. Sinuses/Orbits: No acute finding. Right intra-ocular lens replacement. Other: None. CT CERVICAL SPINE FINDINGS Alignment: C3-4 and C4-5 grade 1 anterolisthesis due to facet hypertrophy. Skull base and vertebrae: No acute fracture. No primary bone lesion or focal pathologic process. Soft tissues and spinal canal: No prevertebral fluid or swelling. No visible canal hematoma. Disc levels: Cervical spondylosis with multilevel disc and facet degenerative changes greatest at the C5-6 level. Uncovertebral and facet hypertrophy results in left-sided bony foraminal stenosis at multiple levels most pronounced at C5 through C7 and on the right at C3 through C6. Canal stenosis is moderate at the C5-6 level. Upper chest: Negative. Other: Negative. IMPRESSION: CT head: 1. Thin mixed attenuation dural hemorrhages over the convexities bilaterally. Tiny dense foci may represent acute hemorrhage or retracted clot. No significant mass effect on the brain. 2. Small densities within anterior sylvian fissures may represent a minimal volume of subarachnoid hemorrhage. 3. No brain parenchymal hemorrhage, stroke, or mass effect. 4. Background of moderate chronic microvascular ischemic changes and parenchymal volume loss of the brain. 5. Left frontal left parietal region scalp lacerations. No  calvarial fracture CT cervical spine: 1. No acute fracture or dislocation. 2. Moderate cervical spondylosis greatest at C5-6 level. No high-grade bony canal stenosis. Critical Value/emergent results were called by telephone at the time of interpretation on 12/30/2017 at 6:29 am to Dr. Veatrice Kells , who verbally acknowledged these results. Electronically Signed   By: Kristine Garbe M.D.   On: 12/30/2017 06:29   Dg Femur Min 2 Views Left  Result Date: 12/30/2017 CLINICAL DATA:  Patient fell with history of recent left femoral fracture. EXAM: LEFT FEMUR 2 VIEWS COMPARISON:  12/12/2017 FINDINGS: Lateral plate and screw fixation of the left femur from the midshaft to the metaphyseal region across stone oblique fracture of  the distal shaft. Fracture lines remain visible with mild callus formation suggested since the previous study. No change in alignment or position and hardware appears intact. Left knee arthroplasty with components appearing intact. Small left knee effusion is suggested. Proximal left femur is included on the left hip views. See additional report. Surgical clips in the medial soft tissues. Vascular calcifications. IMPRESSION: Healing oblique fracture of the distal left femoral shaft post operative plate and screw fixation. No significant change in alignment since previous study. Left knee arthroplasty with components appearing well-seated. No new fractures are demonstrated. Electronically Signed   By: Lucienne Capers M.D.   On: 12/30/2017 06:52   Dg Hips Bilat W Or Wo Pelvis 3-4 Views  Result Date: 12/30/2017 CLINICAL DATA:  Patient fell out of bed and was found prone on the floor. Bilateral hip pain. Previous right hip arthroplasty. EXAM: DG HIP (WITH OR WITHOUT PELVIS) 3-4V BILAT COMPARISON:  None. FINDINGS: Postoperative right hip arthroplasty using non cemented components. Components appear well seated. No evidence of acute fracture of the right hip. Postoperative plate and screw  fixation of the midshaft left femur, incompletely included within the field of view. Degenerative changes in the left hip. No acute fractures demonstrated in the left hip. Pelvis appears intact. SI joints and symphysis pubis are not displaced. IMPRESSION: No acute bony abnormalities. Postoperative right hip arthroplasty. Components appear well seated without dislocation. Postoperative internal fixation of the midshaft left femur, incompletely included within the field of view. Degenerative changes in the left hip. Electronically Signed   By: Lucienne Capers M.D.   On: 12/30/2017 06:49    Procedures Procedures (including critical care time)  Medications Ordered in ED Medications  0.9 %  sodium chloride infusion (has no administration in time range)     Case d/w Dr. Ronnald Ramp, who will review the films.  Please admit to hospitalist   Ordered platelets for transfusion given ASA   Final Clinical Impressions(s) / ED Diagnoses   Admit to medicine    Railey Glad, MD 12/30/17 2924

## 2017-12-30 NOTE — ED Notes (Signed)
Bed: WA14 Expected date:  Expected time:  Means of arrival:  Comments: Hold forRes B 

## 2017-12-30 NOTE — H&P (Signed)
History and Physical    SIAOSI ALTER XAJ:[id number] DOB: [birth year] DOA: 12/30/2017  Referring MD/NP/PA: Jeannett Senior PCP: [name], NP   Patient coming from: SNF  Chief Complaint: fall with head injury  HPI: ANTARIO YASUDA is a 84 y.o. male with history of coronary artery disease, CKD stage III elevated blood pressure, hyperlipidemia, depression, arthritis, dementia without behavioral disturbance who presents with fall and head injury.  History is limited by the patient's dementia and the fact that the fall was unwitnessed.  The patient was hospitalized in March for a fall with hip fracture and he underwent a ORIF of a left supracondylar femur fracture on 3/20 by Dr. Doreatha Martin.  He was discharged to skilled nursing facility with aspirin and Lovenox for DVT prophylaxis.  Overnight last night he had an unwitnessed fall out of bed and was found on the floor bleeding from a laceration above his left eyebrow.  He was calling out for help at the time.  He was transported via EMS to the hospital for evaluation.  No new obvious focal neurologic deficits were noted by family or staff.  Family states he is currently at his baseline mentation and orientation and does not seem to be favoring any particular extremity.    ED Course: Vital signs notable only for mild hypertension.  Labs: Persistent mild anemia since surgery with hemoglobin of 9.6 g/dL.  His platelet count was 239 and his INR was 1.2, PTT 25.  X-rays of his hips and femur demonstrated stability of his recent surgery.  Head CT noted a thin mixed attenuation dural hemorrhage over the convexities bilaterally with a tiny dense foci that may represent acute hemorrhage or retracted clot but without mass-effect on the brain.  There was also a possibility of a minimal volume subarachnoid hemorrhage.  The C-spine demonstrated no acute abnormalities.  The case was discussed with neurosurgery Dr. Ronnald Ramp who did not recommend any neurosurgical  intervention.  He suggested admitting overnight for observation for neurologic deterioration and supportive care and then back to the skilled nursing facility if he remains stable.  Patient had a laceration repair with application of 4 absorbable sutures along his eyebrow in the ER.  Review of Systems:  Limited by patient's dementia.  Denies pain, difficulty breathing, nausea, abdominal discomfort.  Patient voided during my visit and did not complain of pain.   Complete 12 point review of systems reviewed with patient and negative except as mentioned above.    Past Medical History:  Diagnosis Date  . Anemia   . Arthritis   . CAD (coronary artery disease)   . Cataract   . Depression   . High blood pressure   . Hyperlipidemia   . Kidney stones 1998  . Skin cancer    face, ears  . Unspecified dementia without behavioral disturbance     Past Surgical History:  Procedure Laterality Date  . CORONARY ARTERY BYPASS GRAFT    . kidney stone removal    . open heart surgery    . ORIF FEMUR FRACTURE Left 12/12/2017   Procedure: OPEN REDUCTION INTERNAL FIXATION (ORIF) DISTAL FEMUR FRACTURE;  Surgeon: Shona Needles, MD;  Location: [facility];  Service: Orthopedics;  Laterality: Left;  . quadruple bypass     about 22 years ago  . REPLACEMENT TOTAL KNEE Left   . stints     has 3, about 20 years ago  . TOTAL HIP ARTHROPLASTY Right    2008     reports  that he has never smoked. He has never used smokeless tobacco. He reports that he does not drink alcohol or use drugs.  No Known Allergies  Family History  Problem Relation Age of Onset  . Stomach cancer Mother 15  . Esophageal cancer Mother 5  . Stroke Father     Prior to Admission medications   Medication Sig Start Date End Date Taking? Authorizing Provider  acetaminophen (TYLENOL) 500 MG tablet Take 500 mg by mouth every 6 (six) hours as needed.   Yes [provider]  aspirin EC 81 MG tablet Take 81 mg by mouth daily.   Yes  [provider]  benzonatate (TESSALON) 100 MG capsule Take 1 capsule (100 mg total) by mouth 3 (three) times daily as needed for cough. 10/12/17  Yes [name], NP  donepezil (ARICEPT) 10 MG tablet Take 1 tablet (10 mg total) by mouth at bedtime. 10/02/17  Yes [name], NP  enoxaparin (LOVENOX) 40 MG/0.4ML injection Inject 0.4 mLs (40 mg total) into the skin daily. 12/14/17 01/13/18 Yes Haddix, Thomasene Lot, MD  HYDROcodone-acetaminophen (NORCO/VICODIN) 5-325 MG tablet Take 1 tablet by mouth every 6 (six) hours as needed for severe pain. 12/14/17  Yes Haddix, Thomasene Lot, MD  memantine (NAMENDA) 10 MG tablet Take 1 tablet (10 mg total) by mouth 2 (two) times daily. 10/02/17  Yes [name], NP  Multiple Vitamin (MULTIVITAMIN WITH MINERALS) TABS tablet Take 1 tablet by mouth daily.   Yes [provider]  nitroGLYCERIN (NITROSTAT) 0.4 MG SL tablet Place 0.4 mg under the tongue every 5 (five) minutes as needed for chest pain.   Yes [provider]  Nutritional Supplements (BOOST PO) Take 1 Can by mouth 3 (three) times daily.    Yes [provider]  Omega-3 Fatty Acids (FISH OIL) 500 MG CAPS Take 1 capsule by mouth daily.   Yes [provider]  simvastatin (ZOCOR) 10 MG tablet Take 1 tablet (10 mg total) by mouth daily. 10/02/17  Yes [name], NP  vitamin B-12 (CYANOCOBALAMIN) 500 MCG tablet Take 500 mcg by mouth daily.   Yes [provider]  ferrous [id number] C-folic acid (TRINSICON / FOLTRIN) capsule Take 1 capsule by mouth 2 (two) times daily. Patient not taking: Reported on 12/30/2017 12/14/17   Lavina Hamman, MD    Physical Exam: Vitals:   12/30/17 0512 12/30/17 0520 12/30/17 0650  BP:  (!) 162/83 (!) 147/73  Pulse:  75 77  Resp:  14 15  Temp:  98 F (36.7 C)   TempSrc:  Oral   SpO2: 100% 100% 100%  Weight:  72.1 kg (159 lb)   Height:  6\' 2"  (1.88 m)     Constitutional:  NAD, calm, comfortable Eyes:  PERRL, conjunctivae normal, no scleral icterus ENMT:  Moist mucous membranes.  Oropharynx nonerythematous, no exudates.  There is a 3-4 cm laceration that has been sutured with edges well approximated above the left eyebrow with some residual crusted blood on the side of his face but no active bleeding Neck:  No nuchal rigidity, no masses Respiratory:  No wheezes, rales, or rhonchi Cardiovascular: Regular rate and rhythm, no murmurs / rubs / gallops.  2+ radial pulses. Abdomen:  Normal active bowel sounds, soft, nondistended, nontender Musculoskeletal: Normal muscle tone and bulk.  No contractures.  1-2+ pitting edema of the left lower extremity which has been improving since surgery Skin:  no rashes, abrasions, or ulcers.  He has several healing  incisions along the left lateral thigh and a long incision just above the left lateral knee with edges well approximated, no induration or erythema. Neurologic:  CN 2-12 grossly intact. Sensation intact to light touch, strength 5-/5 throughout Psychiatric:  Alert and oriented to person and place. Normal affect.   Labs on Admission: I have personally reviewed following labs and imaging studies  CBC: Recent Labs  Lab 12/30/17 0655  WBC 8.8  NEUTROABS 7.4  HGB 9.6*  HCT 30.6*  MCV 107.7*  PLT 706   Basic Metabolic Panel: Recent Labs  Lab 12/30/17 0655  NA 141  K 4.0  CL 109  CO2 26  GLUCOSE 115*  BUN 22*  CREATININE 1.23  CALCIUM 8.9   GFR: Estimated Creatinine Clearance: 46.4 mL/min (by C-G formula based on SCr of 1.23 mg/dL). Liver Function Tests: Recent Labs  Lab 12/30/17 0655  AST 28  ALT 15*  ALKPHOS 202*  BILITOT 0.8  PROT 6.1*  ALBUMIN 3.2*   No results for input(s): LIPASE, AMYLASE in the last 168 hours. No results for input(s): AMMONIA in the last 168 hours. Coagulation Profile: Recent Labs  Lab 12/30/17 0655  INR 1.20   Cardiac Enzymes: No results for input(s): CKTOTAL, CKMB, CKMBINDEX, TROPONINI in the last  168 hours. BNP (last 3 results) No results for input(s): PROBNP in the last 8760 hours. HbA1C: No results for input(s): HGBA1C in the last 72 hours. CBG: No results for input(s): GLUCAP in the last 168 hours. Lipid Profile: No results for input(s): CHOL, HDL, LDLCALC, TRIG, CHOLHDL, LDLDIRECT in the last 72 hours. Thyroid Function Tests: No results for input(s): TSH, T4TOTAL, FREET4, T3FREE, THYROIDAB in the last 72 hours. Anemia Panel: No results for input(s): VITAMINB12, FOLATE, FERRITIN, TIBC, IRON, RETICCTPCT in the last 72 hours. Urine analysis: No results found for: COLORURINE, APPEARANCEUR, LABSPEC, PHURINE, GLUCOSEU, HGBUR, BILIRUBINUR, KETONESUR, PROTEINUR, UROBILINOGEN, NITRITE, LEUKOCYTESUR Sepsis Labs: @LABRCNTIP (procalcitonin:4,lacticidven:4) )No results found for this or any previous visit (from the past 240 hour(s)).   Radiological Exams on Admission: Ct Head Wo Contrast  Result Date: 12/30/2017 CLINICAL DATA:  84 y/o M; fall from bed found prone on the floor. Two lacerations to the head. EXAM: CT HEAD WITHOUT CONTRAST CT CERVICAL SPINE WITHOUT CONTRAST TECHNIQUE: Multidetector CT imaging of the head and cervical spine was performed following the standard protocol without intravenous contrast. Multiplanar CT image reconstructions of the cervical spine were also generated. COMPARISON:  None. FINDINGS: CT HEAD FINDINGS Brain: There are thin subdural hemorrhages over the convexities bilaterally with small dense foci which may represent acute hemorrhage or retracted clot. No significant mass effect on the brain or herniation. Additionally, there are densities within anterior sylvian fissures bilateral which may represent a very small volume of subarachnoid hemorrhage. No intraventricular hemorrhage or hydrocephalus. 14 mm extra-axial mass of the right greater sphenoid wing (series 2, image 11) likely representing meningioma. Moderate chronic microvascular ischemic changes and  parenchymal volume loss of the brain. Vascular: Calcific atherosclerosis of carotid siphons. Skull: Left frontal left parietal region scalp lacerations. No calvarial fracture. Sinuses/Orbits: No acute finding. Right intra-ocular lens replacement. Other: None. CT CERVICAL SPINE FINDINGS Alignment: C3-4 and C4-5 grade 1 anterolisthesis due to facet hypertrophy. Skull base and vertebrae: No acute fracture. No primary bone lesion or focal pathologic process. Soft tissues and spinal canal: No prevertebral fluid or swelling. No visible canal hematoma. Disc levels: Cervical spondylosis with multilevel disc and facet degenerative changes greatest at the C5-6 level. Uncovertebral and facet hypertrophy results in  left-sided bony foraminal stenosis at multiple levels most pronounced at C5 through C7 and on the right at C3 through C6. Canal stenosis is moderate at the C5-6 level. Upper chest: Negative. Other: Negative. IMPRESSION: CT head: 1. Thin mixed attenuation dural hemorrhages over the convexities bilaterally. Tiny dense foci may represent acute hemorrhage or retracted clot. No significant mass effect on the brain. 2. Small densities within anterior sylvian fissures may represent a minimal volume of subarachnoid hemorrhage. 3. No brain parenchymal hemorrhage, stroke, or mass effect. 4. Background of moderate chronic microvascular ischemic changes and parenchymal volume loss of the brain. 5. Left frontal left parietal region scalp lacerations. No calvarial fracture CT cervical spine: 1. No acute fracture or dislocation. 2. Moderate cervical spondylosis greatest at C5-6 level. No high-grade bony canal stenosis. Critical Value/emergent results were called by telephone at the time of interpretation on 12/30/2017 at 6:29 am to Dr. Veatrice Kells , who verbally acknowledged these results. Electronically Signed   By: Kristine Garbe M.D.   On: 12/30/2017 06:29   Ct Cervical Spine Wo Contrast  Result Date:  12/30/2017 CLINICAL DATA:  84 y/o M; fall from bed found prone on the floor. Two lacerations to the head. EXAM: CT HEAD WITHOUT CONTRAST CT CERVICAL SPINE WITHOUT CONTRAST TECHNIQUE: Multidetector CT imaging of the head and cervical spine was performed following the standard protocol without intravenous contrast. Multiplanar CT image reconstructions of the cervical spine were also generated. COMPARISON:  None. FINDINGS: CT HEAD FINDINGS Brain: There are thin subdural hemorrhages over the convexities bilaterally with small dense foci which may represent acute hemorrhage or retracted clot. No significant mass effect on the brain or herniation. Additionally, there are densities within anterior sylvian fissures bilateral which may represent a very small volume of subarachnoid hemorrhage. No intraventricular hemorrhage or hydrocephalus. 14 mm extra-axial mass of the right greater sphenoid wing (series 2, image 11) likely representing meningioma. Moderate chronic microvascular ischemic changes and parenchymal volume loss of the brain. Vascular: Calcific atherosclerosis of carotid siphons. Skull: Left frontal left parietal region scalp lacerations. No calvarial fracture. Sinuses/Orbits: No acute finding. Right intra-ocular lens replacement. Other: None. CT CERVICAL SPINE FINDINGS Alignment: C3-4 and C4-5 grade 1 anterolisthesis due to facet hypertrophy. Skull base and vertebrae: No acute fracture. No primary bone lesion or focal pathologic process. Soft tissues and spinal canal: No prevertebral fluid or swelling. No visible canal hematoma. Disc levels: Cervical spondylosis with multilevel disc and facet degenerative changes greatest at the C5-6 level. Uncovertebral and facet hypertrophy results in left-sided bony foraminal stenosis at multiple levels most pronounced at C5 through C7 and on the right at C3 through C6. Canal stenosis is moderate at the C5-6 level. Upper chest: Negative. Other: Negative. IMPRESSION: CT head:  1. Thin mixed attenuation dural hemorrhages over the convexities bilaterally. Tiny dense foci may represent acute hemorrhage or retracted clot. No significant mass effect on the brain. 2. Small densities within anterior sylvian fissures may represent a minimal volume of subarachnoid hemorrhage. 3. No brain parenchymal hemorrhage, stroke, or mass effect. 4. Background of moderate chronic microvascular ischemic changes and parenchymal volume loss of the brain. 5. Left frontal left parietal region scalp lacerations. No calvarial fracture CT cervical spine: 1. No acute fracture or dislocation. 2. Moderate cervical spondylosis greatest at C5-6 level. No high-grade bony canal stenosis. Critical Value/emergent results were called by telephone at the time of interpretation on 12/30/2017 at 6:29 am to Dr. Veatrice Kells , who verbally acknowledged these results. Electronically Signed  By: Kristine Garbe M.D.   On: 12/30/2017 06:29   Dg Femur Min 2 Views Left  Result Date: 12/30/2017 CLINICAL DATA:  Patient fell with history of recent left femoral fracture. EXAM: LEFT FEMUR 2 VIEWS COMPARISON:  12/12/2017 FINDINGS: Lateral plate and screw fixation of the left femur from the midshaft to the metaphyseal region across stone oblique fracture of the distal shaft. Fracture lines remain visible with mild callus formation suggested since the previous study. No change in alignment or position and hardware appears intact. Left knee arthroplasty with components appearing intact. Small left knee effusion is suggested. Proximal left femur is included on the left hip views. See additional report. Surgical clips in the medial soft tissues. Vascular calcifications. IMPRESSION: Healing oblique fracture of the distal left femoral shaft post operative plate and screw fixation. No significant change in alignment since previous study. Left knee arthroplasty with components appearing well-seated. No new fractures are demonstrated.  Electronically Signed   By: Lucienne Capers M.D.   On: 12/30/2017 06:52   Dg Hips Bilat W Or Wo Pelvis 3-4 Views  Result Date: 12/30/2017 CLINICAL DATA:  Patient fell out of bed and was found prone on the floor. Bilateral hip pain. Previous right hip arthroplasty. EXAM: DG HIP (WITH OR WITHOUT PELVIS) 3-4V BILAT COMPARISON:  None. FINDINGS: Postoperative right hip arthroplasty using non cemented components. Components appear well seated. No evidence of acute fracture of the right hip. Postoperative plate and screw fixation of the midshaft left femur, incompletely included within the field of view. Degenerative changes in the left hip. No acute fractures demonstrated in the left hip. Pelvis appears intact. SI joints and symphysis pubis are not displaced. IMPRESSION: No acute bony abnormalities. Postoperative right hip arthroplasty. Components appear well seated without dislocation. Postoperative internal fixation of the midshaft left femur, incompletely included within the field of view. Degenerative changes in the left hip. Electronically Signed   By: Lucienne Capers M.D.   On: 12/30/2017 06:49    EKG: Independently reviewed. NSR  Assessment/Plan Active Problems:   * No active hospital problems. *   Tiny subarachnoid hemorrhage bilaterally  -  q4h neuro checks x 24 hours -  Hold asa and lovenox -  No need to repeat head CT or f/u with neurosurgery given GOC -  If he deteriorates, family does not want surgical intervention and would like to focus on comfort measures/hospice.   -  Per neurosurgery:  if no changes in neurologic status, safe to resume DVT prophylaxis in 1-2 weeks  Eyebrow laceration -  Absorbable sutures  Coronary artery disease, essential hypertension and HLD - Stop aspirin - Family thought statin had already been stopped and are okay with discontinuing -  No longer on antihypertensive medication  CKD stage III, creatinine at baseline  Dementia without behavioral  disturbance  -Continue Namenda and Aricept  Recent left hip surgery for hip fracture with recurrent falls -Holding aspirin and Lovenox for DVT prophylaxis -Continue PT  Post-operative anemia -  Continue iron supplementatin  DVT prophylaxis: SCDs Code Status: DNR Family Communication:  Patient, wife and daughter at bedside Disposition Plan: Back to skilled nursing facility tomorrow Consults called: Emergency department physician spoke with neurosurgery Dr. Ronnald Ramp Admission status: Observation, MedSurg  Janece Canterbury MD [facility] Pager [phone]  If 7PM-7AM, please contact night-coverage [email or web address] Password [email or web address]  12/30/2017, 8:29 AM

## 2017-12-31 DIAGNOSIS — I25810 Atherosclerosis of coronary artery bypass graft(s) without angina pectoris: Secondary | ICD-10-CM | POA: Diagnosis not present

## 2017-12-31 DIAGNOSIS — I609 Nontraumatic subarachnoid hemorrhage, unspecified: Secondary | ICD-10-CM

## 2017-12-31 DIAGNOSIS — S065X9A Traumatic subdural hemorrhage with loss of consciousness of unspecified duration, initial encounter: Secondary | ICD-10-CM | POA: Diagnosis not present

## 2017-12-31 DIAGNOSIS — S72452D Displaced supracondylar fracture without intracondylar extension of lower end of left femur, subsequent encounter for closed fracture with routine healing: Secondary | ICD-10-CM | POA: Diagnosis not present

## 2017-12-31 DIAGNOSIS — F0390 Unspecified dementia without behavioral disturbance: Secondary | ICD-10-CM | POA: Diagnosis not present

## 2017-12-31 DIAGNOSIS — I10 Essential (primary) hypertension: Secondary | ICD-10-CM

## 2017-12-31 LAB — TYPE AND SCREEN
ABO/RH(D): A POS
Antibody Screen: NEGATIVE

## 2017-12-31 LAB — BPAM PLATELET PHERESIS
BLOOD PRODUCT EXPIRATION DATE: 201904082359
Unit Type and Rh: 6200

## 2017-12-31 LAB — PREPARE PLATELET PHERESIS: Unit division: 0

## 2017-12-31 NOTE — Evaluation (Signed)
Physical Therapy Evaluation Patient Details Name: Cody Morris MRN: [id number] DOB: 07-Apr-1932 Today's Date: 12/31/2017   History of Present Illness  Cody Morris is a 84 y.o. male with history of coronary artery disease, CKD stage III elevated blood pressure, hyperlipidemia, depression, arthritis, dementia without behavioral disturbance, recent fall with L supracondylar femur fracture/ORIF who presents with fall at nursing facility with [facility].   Clinical Impression  Patient presents with decreased independence with mobility due to pain, decreased strength, decreased activity tolerance, decreased balance and decreased safety awareness with h/o dementia.  Feel he is appropriate for return to SNF level rehab with safety plan in place for future fall prevention.  PT to follow acutely.     Follow Up Recommendations SNF    Equipment Recommendations  Other (comment)(TBA)    Recommendations for Other Services       Precautions / Restrictions Precautions Precautions: Fall Restrictions LLE Weight Bearing: Weight bearing as tolerated      Mobility  Bed Mobility Overal bed mobility: Needs Assistance Bed Mobility: Supine to Sit     Supine to sit: HOB elevated;Mod assist     General bed mobility comments: assist to guide legs off bed and lift trunk  Transfers Overall transfer level: Needs assistance Equipment used: Rolling walker (2 wheeled) Transfers: Sit to/from Stand Sit to Stand: +2 safety/equipment;Mod assist         General transfer comment: lifting assist, cues for technique, increased time to process and follow through with task  Ambulation/Gait Ambulation/Gait assistance: Min assist;Mod assist;+2 safety/equipment Ambulation Distance (Feet): 14 Feet Assistive device: Rolling walker (2 wheeled) Gait Pattern/deviations: Step-to pattern;Decreased stride length;Shuffle;Trunk flexed     General Gait Details: mod cues for posture, forward gaze, sequencing, chair brought to  patient due to pain and some light headedness  Stairs            Wheelchair Mobility    Modified Rankin (Stroke Patients Only)       Balance Overall balance assessment: Needs assistance Sitting-balance support: Feet supported Sitting balance-Leahy Scale: Fair     Standing balance support: Bilateral upper extremity supported Standing balance-Leahy Scale: Poor Standing balance comment: stands with UE support and min A                             Pertinent Vitals/Pain Pain Assessment: Faces Faces Pain Scale: Hurts even more Pain Location: L hip; with ambulation Pain Descriptors / Indicators: Guarding;Grimacing Pain Intervention(s): Limited activity within patient's tolerance;Monitored during session;Repositioned    Home Living Family/patient expects to be discharged to:: Skilled nursing facility Living Arrangements: Spouse/significant other Available Help at Discharge: Family           Home Equipment: Gilford Rile - 2 wheels      Prior Function     Gait / Transfers Assistance Needed: prior to recent fall needed [facility]HA for ambulation, but has been doing therapy at [facility] with PT daily           Hand Dominance        Extremity/Trunk Assessment   Upper Extremity Assessment Upper Extremity Assessment: Generalized weakness(decreased coordination )    Lower Extremity Assessment Lower Extremity Assessment: RLE deficits/detail;LLE deficits/detail RLE Deficits / Details: AAROM grossly WFL, able to lift antigravity, but 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 LLE Deficits / Details: AAROM limited hip/knee flexion about 60 in supine, knee extension strength 3/5    Cervical / Trunk Assessment Cervical / Trunk Assessment: Kyphotic;Other exceptions  Communication   Communication: No difficulties  Cognition Arousal/Alertness: Awake/alert Behavior During Therapy: Flat affect Overall Cognitive Status: History of cognitive impairments - at baseline                                  General Comments: pt has dementia at baseline; pt is pleasant and agreeable and follows simple commands inconsistently      General Comments General comments (skin integrity, edema, etc.): family in room and report there are no bedrails at [facility] and concern for safety; educated to speak with administrator and to advocate for her spouse for resources and more mobility.    Exercises Total Joint Exercises Ankle Circles/Pumps: AROM;AAROM;10 reps;Both;Supine Short Arc Quad: AROM;10 reps;Left;Supine Heel Slides: AAROM;10 reps;Both;Supine   Assessment/Plan    PT Assessment Patient needs continued PT services  PT Problem List Decreased strength;Decreased range of motion;Decreased activity tolerance;Decreased balance;Decreased mobility;Decreased coordination;Decreased cognition;Pain       PT Treatment Interventions DME instruction;Gait training;Functional mobility training;Therapeutic activities;Therapeutic exercise;Balance training;Patient/family education    PT Goals (Current goals can be found in the Care Plan section)  Acute Rehab PT Goals Patient Stated Goal: To walk (per spouse) PT Goal Formulation: With patient/family Time For Goal Achievement: 01/14/18 Potential to Achieve Goals: Fair    Frequency Min 3X/week   Barriers to discharge        Co-evaluation               AM-PAC PT "6 Clicks" Daily Activity  Outcome Measure Difficulty turning over in bed (including adjusting bedclothes, sheets and blankets)?: Unable Difficulty moving from lying on back to sitting on the side of the bed? : Unable Difficulty sitting down on and standing up from a chair with arms (e.g., wheelchair, bedside commode, etc,.)?: Unable       6 Click Score: 3    End of Session Equipment Utilized During Treatment: Gait belt Activity Tolerance: Patient tolerated treatment well Patient left: with chair alarm set;in chair;with family/visitor  present;with call bell/phone within reach   PT Visit Diagnosis: Difficulty in walking, not elsewhere classified (R26.2);Muscle weakness (generalized) (M62.81);Pain Pain - Right/Left: Left Pain - part of body: Leg    Time: 1000-1035 PT Time Calculation (min) (ACUTE ONLY): 35 min   Charges:   PT Evaluation $PT Eval Moderate Complexity: 1 Mod PT Treatments $Gait Training: 8-22 mins   PT G CodesMagda Kiel, [place] [phone] 12/31/2017   Reginia Naas 12/31/2017, 11:38 AM

## 2017-12-31 NOTE — Discharge Summary (Signed)
Physician Discharge Summary  Cody Morris XBJ:[id number] DOB: 1932/05/05 DOA: 12/30/2017  PCP: [name], NP  Admit date: 12/30/2017 Discharge date: 12/31/2017  Admitted From: Home.  Disposition:  Home.   Recommendations for Outpatient Follow-up:  1. Follow up with PCP in 1-2 weeks 2. Please obtain BMP/CBC in one week 3. We are holding aspirin and lovenox in view of the dural hemorrhages, please resume lovenox if needed, in 1 to 2 weeks for DVT prophylaxis for the hip fracture surgery.   Discharge Condition:guarded.  CODE STATUS:DNR Diet recommendation: Heart Healthy   Brief/Interim Summary: Cody Morris is a 84 y.o. male with history of coronary artery disease, CKD stage III elevated blood pressure, hyperlipidemia, depression, arthritis, dementia without behavioral disturbance who presents with fall and head injury. The patient was hospitalized in March for a fall with hip fracture and he underwent a ORIF of a left supracondylar femur fracture on 3/20 by Dr. Doreatha Martin.  He was discharged to skilled nursing facility with aspirin and Lovenox for DVT prophylaxis.  the night prior to admission he had an unwitnessed fall out of bed and was found on the floor bleeding from a laceration above his left eyebrow. He was transferred to [facility] and no new neurological deficits.  CT head shows some dural hemorrhages.  He was admitted overnight for monitoring.       Discharge Diagnoses:  Active Problems:   Subarachnoid hemorrhage (HCC)    Tiny sub arachnoid hemorrhages: Pt currently at baseline. Holding aspirin and lovenox for now.  He worked with PT and doing good.  No need to repeat head CT given GOC. If he deteriorates, family does not want surgical intervention and would like to focus on comfort measures/hospice. As per neuro surgery, safe to resume DVT in 1 to 2 weeks if no neurologic status.     Left eye brow laceration:  Sutures in place.    CAD, hyperlipidemia:  No chest pain.     Stage 3 CKD:  Creatinine at baseline.    Dementia:  Resume namenda and aricept.    H/o recent left hip surgery for the hip fracture:  Continue with PT.    POST op anemia:  Resume iron supplementation.     Discharge Instructions  Discharge Instructions    Diet - low sodium heart healthy   Complete by:  As directed    Discharge instructions   Complete by:  As directed    Please follow up with orthopedics as recommended.  We have stopped the aspirin and lovenox because of the dural hemorrhages.  Please follow up with PCP in 1 to 2 days regarding the lovenox and aspirin.     Allergies as of 12/31/2017   No Known Allergies     Medication List    STOP taking these medications   aspirin EC 81 MG tablet   enoxaparin 40 MG/0.4ML injection Commonly known as:  LOVENOX   HYDROcodone-acetaminophen 5-325 MG tablet Commonly known as:  NORCO/VICODIN     TAKE these medications   acetaminophen 500 MG tablet Commonly known as:  TYLENOL Take 500 mg by mouth every 6 (six) hours as needed.   benzonatate 100 MG capsule Commonly known as:  TESSALON Take 1 capsule (100 mg total) by mouth 3 (three) times daily as needed for cough.   BOOST PO Take 1 Can by mouth 3 (three) times daily.   donepezil 10 MG tablet Commonly known as:  ARICEPT Take 1 tablet (10 mg total) by mouth  at bedtime.   ferrous [id number] C-folic acid capsule Commonly known as:  TRINSICON / FOLTRIN Take 1 capsule by mouth 2 (two) times daily.   Fish Oil 500 MG Caps Take 1 capsule by mouth daily.   memantine 10 MG tablet Commonly known as:  NAMENDA Take 1 tablet (10 mg total) by mouth 2 (two) times daily.   multivitamin with minerals Tabs tablet Take 1 tablet by mouth daily.   nitroGLYCERIN 0.4 MG SL tablet Commonly known as:  NITROSTAT Place 0.4 mg under the tongue every 5 (five) minutes as needed for chest pain.   simvastatin 10 MG tablet Commonly known as:  ZOCOR Take 1 tablet (10  mg total) by mouth daily.   vitamin B-12 500 MCG tablet Commonly known as:  CYANOCOBALAMIN Take 500 mcg by mouth daily.      Contact information for after-discharge care    Destination    [facility] .   Service:  Skilled Nursing Contact information: [place] [phone]             No Known Allergies  Consultations:  Physical therapy.    Procedures/Studies: Dg Chest 1 View  Result Date: 12/11/2017 CLINICAL DATA:  Recent fall with known left femoral fracture EXAM: CHEST  1 VIEW COMPARISON:  None. FINDINGS: Cardiac shadow is within normal limits. Postsurgical changes are seen. The second most superior sternal wire is fractured of uncertain chronicity. The lungs are clear bilaterally. No acute bony abnormality is noted. IMPRESSION: No acute abnormality noted. Electronically Signed   By: Inez Catalina M.D.   On: 12/11/2017 08:53   Dg Knee 1-2 Views Left  Result Date: 12/12/2017 CLINICAL DATA:  ORIF of left femoral fracture EXAM: LEFT KNEE - 1-2 VIEW; DG C-ARM 61-120 MIN COMPARISON:  Same day radiographs of the left femur FINDINGS: 1 minutes 49 seconds of fluoroscopic time was utilized during open reduction internal fixation of an acute, closed, oblique fracture of the distal diaphysis of the left femur. Subsequent images demonstrate lateral plate and screw fixation across the oblique fracture with improved anatomic alignment. Pre-existing knee arthroplasties partially included and unremarkable. IMPRESSION: 1. ORIF of distal left femoral diaphyseal fracture with plate and screw fixation. Improved alignment is demonstrated. 2. Fluoroscopic time was utilized for a total of 1 minutes 49 seconds. Electronically Signed   By: Ashley Royalty M.D.   On: 12/12/2017 18:44   Ct Head Wo Contrast  Result Date: 12/30/2017 CLINICAL DATA:  84 y/o M; fall from bed found prone on the floor. Two lacerations to the head. EXAM: CT HEAD  WITHOUT CONTRAST CT CERVICAL SPINE WITHOUT CONTRAST TECHNIQUE: Multidetector CT imaging of the head and cervical spine was performed following the standard protocol without intravenous contrast. Multiplanar CT image reconstructions of the cervical spine were also generated. COMPARISON:  None. FINDINGS: CT HEAD FINDINGS Brain: There are thin subdural hemorrhages over the convexities bilaterally with small dense foci which may represent acute hemorrhage or retracted clot. No significant mass effect on the brain or herniation. Additionally, there are densities within anterior sylvian fissures bilateral which may represent a very small volume of subarachnoid hemorrhage. No intraventricular hemorrhage or hydrocephalus. 14 mm extra-axial mass of the right greater sphenoid wing (series 2, image 11) likely representing meningioma. Moderate chronic microvascular ischemic changes and parenchymal volume loss of the brain. Vascular: Calcific atherosclerosis of carotid siphons. Skull: Left frontal left parietal region scalp lacerations. No calvarial fracture. Sinuses/Orbits: No acute finding. Right intra-ocular lens  replacement. Other: None. CT CERVICAL SPINE FINDINGS Alignment: C3-4 and C4-5 grade 1 anterolisthesis due to facet hypertrophy. Skull base and vertebrae: No acute fracture. No primary bone lesion or focal pathologic process. Soft tissues and spinal canal: No prevertebral fluid or swelling. No visible canal hematoma. Disc levels: Cervical spondylosis with multilevel disc and facet degenerative changes greatest at the C5-6 level. Uncovertebral and facet hypertrophy results in left-sided bony foraminal stenosis at multiple levels most pronounced at C5 through C7 and on the right at C3 through C6. Canal stenosis is moderate at the C5-6 level. Upper chest: Negative. Other: Negative. IMPRESSION: CT head: 1. Thin mixed attenuation dural hemorrhages over the convexities bilaterally. Tiny dense foci may represent acute  hemorrhage or retracted clot. No significant mass effect on the brain. 2. Small densities within anterior sylvian fissures may represent a minimal volume of subarachnoid hemorrhage. 3. No brain parenchymal hemorrhage, stroke, or mass effect. 4. Background of moderate chronic microvascular ischemic changes and parenchymal volume loss of the brain. 5. Left frontal left parietal region scalp lacerations. No calvarial fracture CT cervical spine: 1. No acute fracture or dislocation. 2. Moderate cervical spondylosis greatest at C5-6 level. No high-grade bony canal stenosis. Critical Value/emergent results were called by telephone at the time of interpretation on 12/30/2017 at 6:29 am to Dr. Veatrice Kells , who verbally acknowledged these results. Electronically Signed   By: Kristine Garbe M.D.   On: 12/30/2017 06:29   Ct Cervical Spine Wo Contrast  Result Date: 12/30/2017 CLINICAL DATA:  84 y/o M; fall from bed found prone on the floor. Two lacerations to the head. EXAM: CT HEAD WITHOUT CONTRAST CT CERVICAL SPINE WITHOUT CONTRAST TECHNIQUE: Multidetector CT imaging of the head and cervical spine was performed following the standard protocol without intravenous contrast. Multiplanar CT image reconstructions of the cervical spine were also generated. COMPARISON:  None. FINDINGS: CT HEAD FINDINGS Brain: There are thin subdural hemorrhages over the convexities bilaterally with small dense foci which may represent acute hemorrhage or retracted clot. No significant mass effect on the brain or herniation. Additionally, there are densities within anterior sylvian fissures bilateral which may represent a very small volume of subarachnoid hemorrhage. No intraventricular hemorrhage or hydrocephalus. 14 mm extra-axial mass of the right greater sphenoid wing (series 2, image 11) likely representing meningioma. Moderate chronic microvascular ischemic changes and parenchymal volume loss of the brain. Vascular: Calcific  atherosclerosis of carotid siphons. Skull: Left frontal left parietal region scalp lacerations. No calvarial fracture. Sinuses/Orbits: No acute finding. Right intra-ocular lens replacement. Other: None. CT CERVICAL SPINE FINDINGS Alignment: C3-4 and C4-5 grade 1 anterolisthesis due to facet hypertrophy. Skull base and vertebrae: No acute fracture. No primary bone lesion or focal pathologic process. Soft tissues and spinal canal: No prevertebral fluid or swelling. No visible canal hematoma. Disc levels: Cervical spondylosis with multilevel disc and facet degenerative changes greatest at the C5-6 level. Uncovertebral and facet hypertrophy results in left-sided bony foraminal stenosis at multiple levels most pronounced at C5 through C7 and on the right at C3 through C6. Canal stenosis is moderate at the C5-6 level. Upper chest: Negative. Other: Negative. IMPRESSION: CT head: 1. Thin mixed attenuation dural hemorrhages over the convexities bilaterally. Tiny dense foci may represent acute hemorrhage or retracted clot. No significant mass effect on the brain. 2. Small densities within anterior sylvian fissures may represent a minimal volume of subarachnoid hemorrhage. 3. No brain parenchymal hemorrhage, stroke, or mass effect. 4. Background of moderate chronic microvascular ischemic changes and parenchymal  volume loss of the brain. 5. Left frontal left parietal region scalp lacerations. No calvarial fracture CT cervical spine: 1. No acute fracture or dislocation. 2. Moderate cervical spondylosis greatest at C5-6 level. No high-grade bony canal stenosis. Critical Value/emergent results were called by telephone at the time of interpretation on 12/30/2017 at 6:29 am to Dr. Veatrice Kells , who verbally acknowledged these results. Electronically Signed   By: Kristine Garbe M.D.   On: 12/30/2017 06:29   Dg C-arm 1-60 Min  Result Date: 12/12/2017 CLINICAL DATA:  ORIF of left femoral fracture EXAM: LEFT KNEE - 1-2  VIEW; DG C-ARM 61-120 MIN COMPARISON:  Same day radiographs of the left femur FINDINGS: 1 minutes 49 seconds of fluoroscopic time was utilized during open reduction internal fixation of an acute, closed, oblique fracture of the distal diaphysis of the left femur. Subsequent images demonstrate lateral plate and screw fixation across the oblique fracture with improved anatomic alignment. Pre-existing knee arthroplasties partially included and unremarkable. IMPRESSION: 1. ORIF of distal left femoral diaphyseal fracture with plate and screw fixation. Improved alignment is demonstrated. 2. Fluoroscopic time was utilized for a total of 1 minutes 49 seconds. Electronically Signed   By: Ashley Royalty M.D.   On: 12/12/2017 18:44   Dg Hip Unilat With Pelvis 2-3 Views Left  Result Date: 12/11/2017 CLINICAL DATA:  Fall yesterday with left hip pain, initial encounter EXAM: DG HIP (WITH OR WITHOUT PELVIS) 2-3V LEFT COMPARISON:  None. FINDINGS: Right hip prosthesis is noted. Pelvic ring is intact. No acute fracture or dislocation is noted. Degenerative changes of the left hip joint are noted. No other focal abnormality is seen. IMPRESSION: Degenerative change without acute abnormality. Electronically Signed   By: Inez Catalina M.D.   On: 12/11/2017 08:50   Dg Femur Min 2 Views Left  Result Date: 12/30/2017 CLINICAL DATA:  Patient fell with history of recent left femoral fracture. EXAM: LEFT FEMUR 2 VIEWS COMPARISON:  12/12/2017 FINDINGS: Lateral plate and screw fixation of the left femur from the midshaft to the metaphyseal region across stone oblique fracture of the distal shaft. Fracture lines remain visible with mild callus formation suggested since the previous study. No change in alignment or position and hardware appears intact. Left knee arthroplasty with components appearing intact. Small left knee effusion is suggested. Proximal left femur is included on the left hip views. See additional report. Surgical clips in the  medial soft tissues. Vascular calcifications. IMPRESSION: Healing oblique fracture of the distal left femoral shaft post operative plate and screw fixation. No significant change in alignment since previous study. Left knee arthroplasty with components appearing well-seated. No new fractures are demonstrated. Electronically Signed   By: Lucienne Capers M.D.   On: 12/30/2017 06:52   Dg Femur Min 2 Views Left  Result Date: 12/11/2017 CLINICAL DATA:  Fall yesterday with left leg pain, initial encounter EXAM: LEFT FEMUR 2 VIEWS COMPARISON:  None. FINDINGS: Left knee prosthesis is noted in satisfactory position. Just above the knee prosthesis at the junction of the diaphysis and metaphysis in the distal left femur there is an oblique fracture identified. Some medial and posterior angulation of the distal fracture fragment is noted. No significant soft tissue abnormality is noted. IMPRESSION: Oblique fracture through the mid to distal left femur. Electronically Signed   By: Inez Catalina M.D.   On: 12/11/2017 08:52   Dg Femur Port Min 2 Views Left  Result Date: 12/12/2017 CLINICAL DATA:  Status post ORIF, left femur fracture EXAM: LEFT  FEMUR PORTABLE 2 VIEWS COMPARISON:  12/11/2017 FINDINGS: Prior left knee replacement with normal alignment. Interval surgical plate and screw fixation of the mid to distal femur across a fracture involving the distal shaft of the femur. Mild residual anterior and medial displacement of distal fracture fragment but overall decreased compared to prior study. Gas within the soft tissues of the lateral 5. Clips in the medial proximal thigh. Vascular calcifications. IMPRESSION: Interval surgical plate and screw fixation of distal femoral fracture with decreased displacement. Gas in the soft tissues consistent with recent operative status Electronically Signed   By: Donavan Foil M.D.   On: 12/12/2017 20:48   Dg Hips Bilat W Or Wo Pelvis 3-4 Views  Result Date: 12/30/2017 CLINICAL  DATA:  Patient fell out of bed and was found prone on the floor. Bilateral hip pain. Previous right hip arthroplasty. EXAM: DG HIP (WITH OR WITHOUT PELVIS) 3-4V BILAT COMPARISON:  None. FINDINGS: Postoperative right hip arthroplasty using non cemented components. Components appear well seated. No evidence of acute fracture of the right hip. Postoperative plate and screw fixation of the midshaft left femur, incompletely included within the field of view. Degenerative changes in the left hip. No acute fractures demonstrated in the left hip. Pelvis appears intact. SI joints and symphysis pubis are not displaced. IMPRESSION: No acute bony abnormalities. Postoperative right hip arthroplasty. Components appear well seated without dislocation. Postoperative internal fixation of the midshaft left femur, incompletely included within the field of view. Degenerative changes in the left hip. Electronically Signed   By: Lucienne Capers M.D.   On: 12/30/2017 06:49     Subjective: No new complaints. Alert and comfortable, not in any distress.   Discharge Exam: Vitals:   12/30/17 2129 12/30/17 2253  BP: 140/86   Pulse: 84   Resp: 16   Temp: 98.5 F (36.9 C) 99.6 F (37.6 C)  SpO2: 100%    Vitals:   12/30/17 1050 12/30/17 1237 12/30/17 2129 12/30/17 2253  BP: (!) 145/80 138/84 140/86   Pulse: 86  84   Resp: 16  16   Temp: 99.9 F (37.7 C) 99.1 F (37.3 C) 98.5 F (36.9 C) 99.6 F (37.6 C)  TempSrc: Oral Oral Oral   SpO2: 99%  100%   Weight: 72.8 kg (160 lb 8 oz)     Height: 6\' 2"  (1.88 m)       General: Pt is alert, awake, not in acute distress, stiches on the left side of the forehead and staples ont heleft side of the head.  Cardiovascular: RRR, S1/S2 +, no rubs, no gallops Respiratory: CTA bilaterally, no wheezing, no rhonchi Abdominal: Soft, NT, ND, bowel sounds + Extremities: left upper arm bruised at the elbow area    The results of significant diagnostics from this hospitalization  (including imaging, microbiology, ancillary and laboratory) are listed below for reference.     Microbiology: No results found for this or any previous visit (from the past 240 hour(s)).   Labs: BNP (last 3 results) No results for input(s): BNP in the last 8760 hours. Basic Metabolic Panel: Recent Labs  Lab 12/30/17 0655  NA 141  K 4.0  CL 109  CO2 26  GLUCOSE 115*  BUN 22*  CREATININE 1.23  CALCIUM 8.9   Liver Function Tests: Recent Labs  Lab 12/30/17 0655  AST 28  ALT 15*  ALKPHOS 202*  BILITOT 0.8  PROT 6.1*  ALBUMIN 3.2*   No results for input(s): LIPASE, AMYLASE in the last 168  hours. No results for input(s): AMMONIA in the last 168 hours. CBC: Recent Labs  Lab 12/30/17 0655  WBC 8.8  NEUTROABS 7.4  HGB 9.6*  HCT 30.6*  MCV 107.7*  PLT 239   Cardiac Enzymes: No results for input(s): CKTOTAL, CKMB, CKMBINDEX, TROPONINI in the last 168 hours. BNP: Invalid input(s): POCBNP CBG: No results for input(s): GLUCAP in the last 168 hours. D-Dimer No results for input(s): DDIMER in the last 72 hours. Hgb A1c No results for input(s): HGBA1C in the last 72 hours. Lipid Profile No results for input(s): CHOL, HDL, LDLCALC, TRIG, CHOLHDL, LDLDIRECT in the last 72 hours. Thyroid function studies No results for input(s): TSH, T4TOTAL, T3FREE, THYROIDAB in the last 72 hours.  Invalid input(s): FREET3 Anemia work up No results for input(s): VITAMINB12, FOLATE, FERRITIN, TIBC, IRON, RETICCTPCT in the last 72 hours. Urinalysis No results found for: COLORURINE, APPEARANCEUR, LABSPEC, [place], GLUCOSEU, HGBUR, BILIRUBINUR, KETONESUR, PROTEINUR, UROBILINOGEN, NITRITE, LEUKOCYTESUR Sepsis Labs Invalid input(s): PROCALCITONIN,  WBC,  LACTICIDVEN Microbiology No results found for this or any previous visit (from the past 240 hour(s)).   Time coordinating discharge: 35 minutes  SIGNED:   Hosie Poisson, MD  [facility] 12/31/2017, 10:56 AM Pager   If  7PM-7AM, please contact night-coverage [email or web address] Password [email or web address]

## 2017-12-31 NOTE — Progress Notes (Signed)
Patient released to care of [facility].  All information in the packet transferred. Patient family present for transfer.

## 2017-12-31 NOTE — Progress Notes (Signed)
CSW consulted to assist with disposition as pt is admitted from facility- Has been at [facility] for ST rehab since 12/14/17. Sustained a fall and was admitted under observation at [facility] overnight. Met with pt, wife, and son at bedside- explained plan will be to return to [facility] at [place] (planned for today, work up for fall complete, pt participated in therapy this morning and family pleased with care received). Wife considering hiring a sitter to be with pt at night at [facility] as she is concerned about pt "trying to get out of bed by himself and falling again." States pt is generally very oriented and responsive in the morning but confused at night. Wife reports goal is "to get him to where he can get out of bed and to a wheelchair safely on his own, and able to walk a few steps so I can help him to the bathroom, then he can come home with me again. I've been his caretaker for years, I like having him home." CSW communicating with [facility] to arrange return at [place].  See below for complete assessment 12/13/17. No major changes in psychosocial situation.  Sharren Bridge, MSW, LCSW Clinical Social Work 12/31/2017 [phone]   Clinical Social Work Assessment  Patient Details  Name: Cody Morris MRN: [id number] Date of Birth: 02-03-1932  Date of referral:  12/13/17               Reason for consult:  Facility Placement                 Permission sought to share information with:  Chartered certified accountant granted to share information::  Yes, Verbal Permission Granted             Name::     [name]::  SNF             Relationship::  spouse & daughter             Contact Information:     Housing/Transportation Living arrangements for the past 2 months:  Single Family Home Source of Information:  Adult Children, Spouse Patient Interpreter Needed:  None Criminal Activity/Legal Involvement Pertinent to Current Situation/Hospitalization:   No - Comment as needed Significant Relationships:  Adult Children, Other Family Members, Spouse Lives with:  Spouse Do you feel safe going back to the place where you live?  No Need for family participation in patient care:  Yes (Comment)  Care giving concerns:  Pt has new impairment and will need short term rehab prior to home.  Social Worker assessment / plan:  CSW met with patient,spouse and daughter at bedside to discuss SNF recommendation. Family in agreement and indicated that patient has been to SNF before. Pt/family gave permission to send to SNF's in the local area. CSW explained the process/placement/insurance and discharge. CSW will f/u for disposition.  Employment status:  Retired Forensic scientist:  Medicare PT Recommendations:  [place] / Referral to community resources:  [place]  Patient/Family's Response to care:  Family thanked CSW for explaining SNF process and discharge. No issues or concerns.  Patient/Family's Understanding of and Emotional Response to Diagnosis, Current Treatment, and Prognosis:  Patient familly has good understanding of the impairment and agreeable to SNF as they are unable to care for patient with new impairment. Spouse is elderly and cares for patient at home and desires him to return once  he has completed rehabilitation. CSW will assist with disposition. No issues or concerns identified.  Emotional Assessment Appearance:  Appears stated age Attitude/Demeanor/Rapport:  (Cooperative) Affect (typically observed):  Calm Orientation:  Oriented to Self Alcohol / Substance use:  Not Applicable Psych involvement (Current and /or in the community):  No (Comment)  Discharge Needs  Concerns to be addressed:  Discharge Planning Concerns Readmission within the last 30 days:  No Current discharge risk:  Dependent with Mobility, Physical Impairment Barriers to Discharge:  No Barriers  Identified   [name], LCSW 12/13/2017, 12:22 PM

## 2017-12-31 NOTE — Progress Notes (Signed)
Pt returning to [facility] SNF to continue [place] rehab- report [phone]. Wife and son when to facility to complete readmission paperwork. Will advise CSW when complete and then will arrange [facility] transport back to [facility].  Sharren Bridge, MSW, LCSW Clinical Social Work 12/31/2017 [phone]

## 2017-12-31 NOTE — Progress Notes (Signed)
IV removed, gauze dressing clean and dry. Paper gown put on pt. Report called to [facility]. Jinny Blossom the social worker called [facility] to transport pt to [facility]. Pt sitting up in chair with legs elevated and sleeping. Call light on lap. Wife has called a couple times wondering why her husband has not been picked up. Explained the ambulance was called and that he is on a waiting list to be picked up and that he was sleeping. The wife called again and stated she called the head of transportation at [facility] and he said a Education officer, museum was in sitting with the patient waiting for the ambulance to come. I explained there was no Education officer, museum here sitting with the Patient, I have been checking on him and he is asleep. Wife returned to [facility] and was extremely upset the patient hadn't been picked up and that he was still sitting up in the chair. [facility] had been called and it was verified he was still on the list to be picked up. Charge nurse Wyatt Portela spoke with family.

## 2018-01-03 ENCOUNTER — Ambulatory Visit: Payer: Medicare Other | Admitting: Internal Medicine

## 2018-01-03 ENCOUNTER — Ambulatory Visit: Payer: Medicare Other

## 2018-01-27 DIAGNOSIS — F0280 Dementia in other diseases classified elsewhere without behavioral disturbance: Secondary | ICD-10-CM | POA: Diagnosis not present

## 2018-01-27 DIAGNOSIS — S72451D Displaced supracondylar fracture without intracondylar extension of lower end of right femur, subsequent encounter for closed fracture with routine healing: Secondary | ICD-10-CM

## 2018-01-27 DIAGNOSIS — M1612 Unilateral primary osteoarthritis, left hip: Secondary | ICD-10-CM | POA: Diagnosis not present

## 2018-01-27 DIAGNOSIS — E538 Deficiency of other specified B group vitamins: Secondary | ICD-10-CM | POA: Diagnosis not present

## 2018-01-27 DIAGNOSIS — M9701XD Periprosthetic fracture around internal prosthetic right hip joint, subsequent encounter: Secondary | ICD-10-CM

## 2018-01-27 DIAGNOSIS — I2510 Atherosclerotic heart disease of native coronary artery without angina pectoris: Secondary | ICD-10-CM

## 2018-01-27 DIAGNOSIS — I10 Essential (primary) hypertension: Secondary | ICD-10-CM | POA: Diagnosis not present

## 2018-01-27 DIAGNOSIS — G309 Alzheimer's disease, unspecified: Secondary | ICD-10-CM | POA: Diagnosis not present

## 2018-01-29 ENCOUNTER — Telehealth: Payer: Self-pay | Admitting: *Deleted

## 2018-01-29 NOTE — Telephone Encounter (Signed)
Rose, wife called and wanted to make Janett Billow Aware that patient had fallen and broken leg. Has been in and out of [facility]. Patient is now at home and has gotten worse, can't even walk. Son in law has to lift patient in the morning and place in chair and lift him in the evening and place in bed.  Wife is wanting to give you a heads up that [facility]  [phone] is going to be sending orders for an Aid and Lift.

## 2018-01-29 NOTE — Telephone Encounter (Signed)
Okay, hopefully with a lift they can move him more easily, we will need to see him in office for follow up when able

## 2018-02-05 ENCOUNTER — Telehealth: Payer: Self-pay | Admitting: *Deleted

## 2018-02-05 NOTE — Telephone Encounter (Signed)
Okay thank you

## 2018-02-05 NOTE — Telephone Encounter (Signed)
We received [facility] orders for Gel Overlay and a Lift. Cody Morris cannot fill out paperwork appropriately without seeing the patient first. Insurance requesting OV notes documenting the need.  Called and spoke with wife, Kalman Shan and she stated there was no way to get the patient here because is is not mobile. Stated that they would look into different options. Stated that she will call back if she needs to schedule an appointment later. Cannot bring him him now.

## 2018-02-05 NOTE — Telephone Encounter (Signed)
Amy with [facility] [phone] called and requested verbal that Janett Billow would be attending.   I spoke with Janett Billow and she agreed to be attending. Verbal given to Amy with [facility].

## 2018-02-25 ENCOUNTER — Telehealth: Payer: Self-pay

## 2018-02-25 NOTE — Telephone Encounter (Signed)
Would like a little more information, what type of pain? How severe? How often? What is their recommendation at this time since they are following the patient closely

## 2018-02-25 NOTE — Telephone Encounter (Signed)
[name] with [facility] called to inform [facility] that patient is declining and is experiencing more pain. [name] would like RX for Roxanol or Morphine sent to [facility].  [name] would like a return call with decision

## 2018-02-25 NOTE — Telephone Encounter (Signed)
[facility] nurse stated that she will just call the [facility]osp[facility] provider and have something called in. Stated that she will call back tomorrow with an update and more information. Agreed.

## 2018-02-26 NOTE — Telephone Encounter (Signed)
Thank you :)

## 2018-03-25 DEATH — deceased

## 2018-10-24 IMAGING — CR DG HIP (WITH OR WITHOUT PELVIS) 3-4V BILAT
5 series · 5 of 5 positions shown · non-contrast
Comparison: None.

CLINICAL DATA: Patient fell out of bed and was found prone on the
floor. Bilateral hip pain. Previous right hip arthroplasty.

EXAM:
DG HIP (WITH OR WITHOUT PELVIS) 3-4V BILAT

[x pelvis]
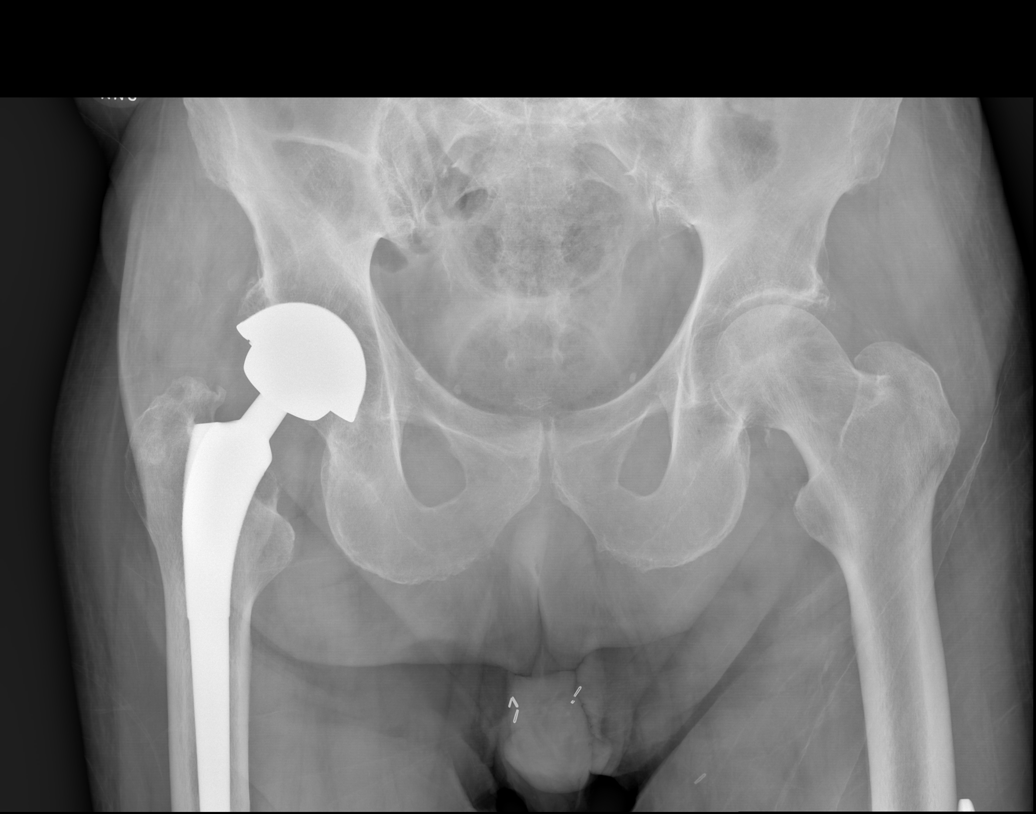

[x hip ap right]
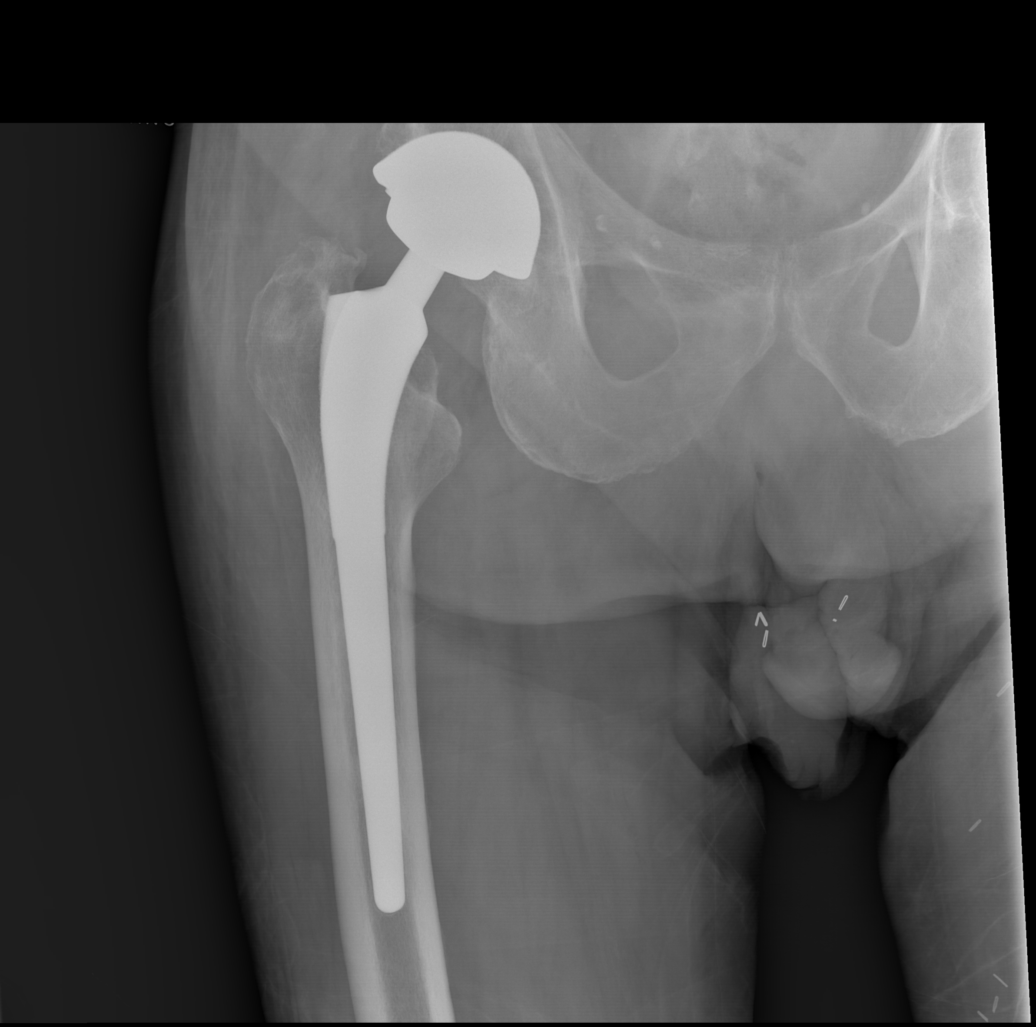

[x hip ap left]
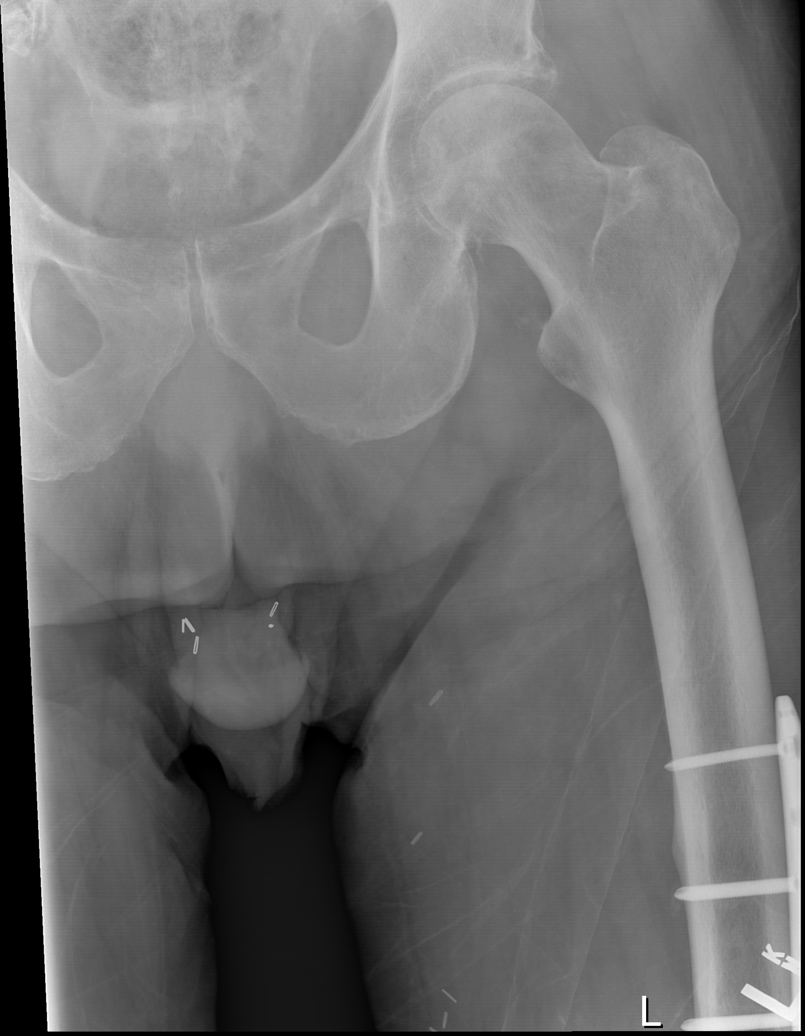

[x hip lat left]
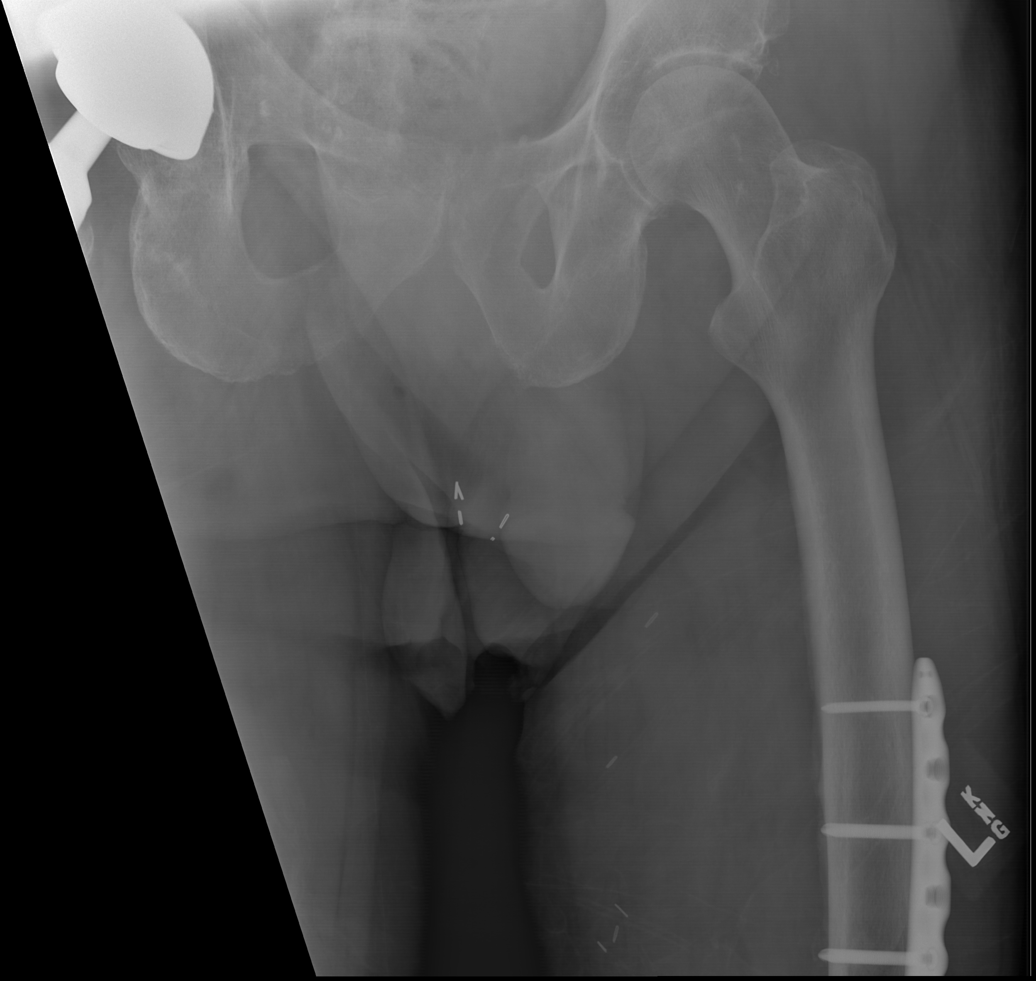

[x hip lat right]
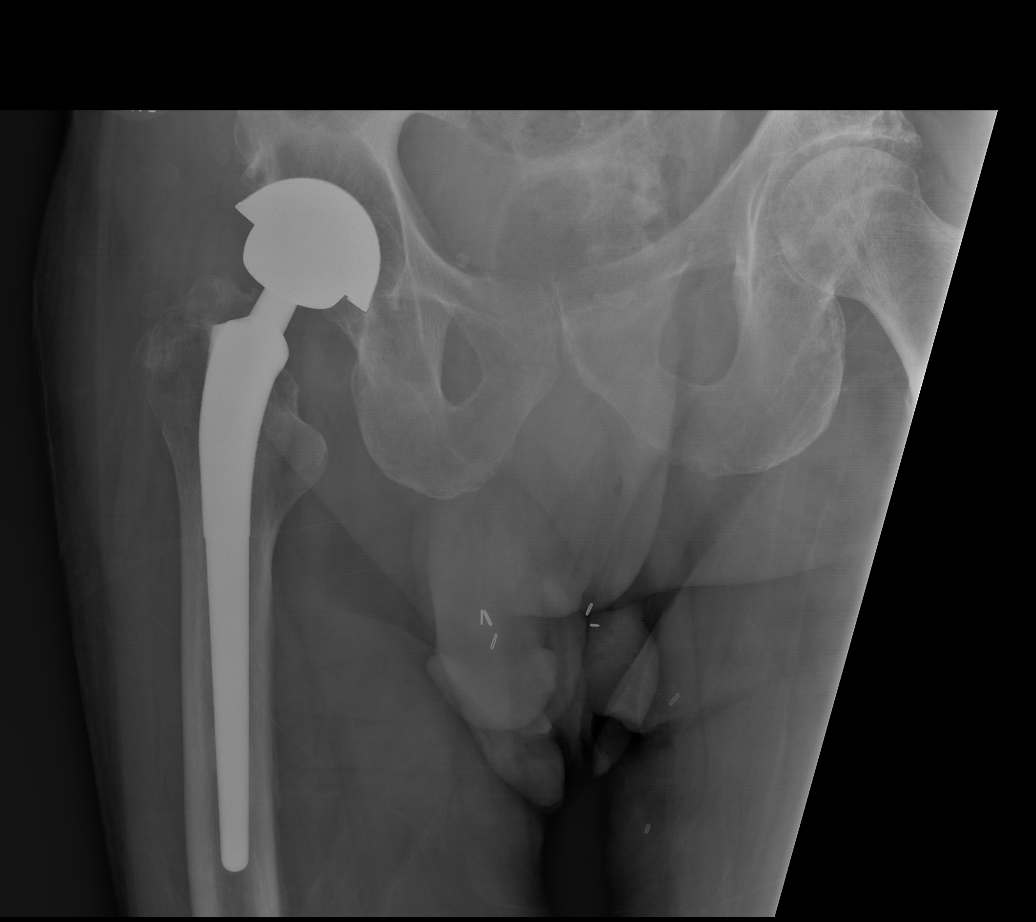

[5 of 5 positions shown; findings below may reference images not displayed]

FINDINGS: Postoperative right hip arthroplasty using non cemented components.
Components appear well seated. No evidence of acute fracture of the
right hip. Postoperative plate and screw fixation of the midshaft
left femur, incompletely included within the field of view.
Degenerative changes in the left hip. No acute fractures
demonstrated in the left hip. Pelvis appears intact. SI joints and
symphysis pubis are not displaced.
IMPRESSION: No acute bony abnormalities. Postoperative right hip arthroplasty.
Components appear well seated without dislocation. Postoperative
internal fixation of the midshaft left femur, incompletely included
within the field of view. Degenerative changes in the left hip.
